# Patient Record
Sex: Female | Born: 1980 | Race: Black or African American | Hispanic: No | Marital: Single | Smoking: Current every day smoker
Health system: Southern US, Community
[De-identification: ages and names within clinical notes are randomized; demographics above are authoritative.]

## PROBLEM LIST (undated history)

## (undated) HISTORY — PX: COLPOSCOPY: SHX161

---
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
## 2003-12-29 ENCOUNTER — Other Ambulatory Visit: Admission: RE | Admit: 2003-12-29 | Discharge: 2003-12-29 | Payer: Self-pay | Admitting: Obstetrics and Gynecology

## 2004-02-14 ENCOUNTER — Inpatient Hospital Stay (HOSPITAL_COMMUNITY): Admission: AD | Admit: 2004-02-14 | Discharge: 2004-02-15 | Payer: Self-pay | Admitting: Obstetrics and Gynecology

## 2004-04-19 ENCOUNTER — Emergency Department (HOSPITAL_COMMUNITY): Admission: EM | Admit: 2004-04-19 | Discharge: 2004-04-20 | Payer: Self-pay | Admitting: Emergency Medicine

## 2004-07-28 ENCOUNTER — Emergency Department (HOSPITAL_COMMUNITY): Admission: EM | Admit: 2004-07-28 | Discharge: 2004-07-28 | Payer: Self-pay | Admitting: Emergency Medicine

## 2006-09-03 ENCOUNTER — Encounter: Admission: RE | Admit: 2006-09-03 | Discharge: 2006-09-03 | Payer: Self-pay | Admitting: Family Medicine

## 2007-06-30 ENCOUNTER — Emergency Department (HOSPITAL_COMMUNITY): Admission: EM | Admit: 2007-06-30 | Discharge: 2007-06-30 | Payer: Self-pay | Admitting: Emergency Medicine

## 2007-07-24 ENCOUNTER — Ambulatory Visit: Payer: Self-pay | Admitting: Obstetrics & Gynecology

## 2007-09-04 ENCOUNTER — Ambulatory Visit: Payer: Self-pay | Admitting: Family Medicine

## 2007-09-04 ENCOUNTER — Other Ambulatory Visit: Admission: RE | Admit: 2007-09-04 | Discharge: 2007-09-04 | Payer: Self-pay | Admitting: Family Medicine

## 2007-09-25 ENCOUNTER — Ambulatory Visit: Payer: Self-pay | Admitting: Gynecology

## 2007-10-02 ENCOUNTER — Ambulatory Visit: Payer: Self-pay | Admitting: Obstetrics & Gynecology

## 2010-02-26 ENCOUNTER — Emergency Department (HOSPITAL_COMMUNITY): Admission: EM | Admit: 2010-02-26 | Discharge: 2010-02-26 | Payer: Self-pay | Admitting: Emergency Medicine

## 2010-09-10 ENCOUNTER — Encounter: Payer: Self-pay | Admitting: Family Medicine

## 2011-01-02 NOTE — Group Therapy Note (Signed)
[name], [name] NO.:  [id number]   MEDICAL RECORD NO.:  [id number]          PATIENT TYPE:  WOC   LOCATION:  [facility]                   FACILITY:  [facility]   PHYSICIAN:  Tinnie Gens, MD        DATE OF BIRTH:  01/30/1981   DATE OF SERVICE:  09/04/2007                                  CLINIC NOTE   CHIEF COMPLAINT:  Need for LEEP.   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  She has been counseled at 46.   PROCEDURE:  After informed consent colposcopy is performed.  No  significant her eyes were seen by culpo.  However her biopsy as  previously been shown to have a CIN III so a medium-sized fissure cone  LEEP was performed after paracervical block with lidocaine.  Post LEEP  with the LEEP was cauterized with the electrocautery ball and hemostasis  obtained with monoethyl solution the patient tolerated the procedure  well.   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   PLAN:  Follow up 2 weeks for recheck of LEEP and review of pathology.  The patient will require Pap smears q.4 months for the next year.           ______________________________  Tinnie Gens, MD     TP/MEDQ  D:  09/04/2007  T:  09/04/2007  Job:  [id number]

## 2011-05-10 LAB — POCT PREGNANCY, URINE
Operator id: 148111
Preg Test, Ur: NEGATIVE

## 2011-05-11 LAB — POCT URINALYSIS DIP (DEVICE)
Bilirubin Urine: NEGATIVE
Glucose, UA: NEGATIVE
Nitrite: NEGATIVE

## 2011-05-29 LAB — COMPREHENSIVE METABOLIC PANEL
ALT: 15
Alkaline Phosphatase: 62
CO2: 26
Chloride: 106
GFR calc non Af Amer: >60
Glucose, Bld: 87
Potassium: 4
Sodium: 138
Total Protein: 7.3

## 2011-05-29 LAB — DIFFERENTIAL
Basophils Relative: 0
Eosinophils Absolute: 0.1 — ABNORMAL LOW
Monocytes Relative: 8
Neutrophils Relative %: 67

## 2011-05-29 LAB — POCT PREGNANCY, URINE: Operator id: 231701

## 2011-05-29 LAB — CBC
Hemoglobin: 14.4
RBC: 4.81
WBC: 5.2

## 2011-07-31 ENCOUNTER — Ambulatory Visit: Payer: Self-pay

## 2013-02-11 ENCOUNTER — Emergency Department (HOSPITAL_COMMUNITY): Payer: Self-pay

## 2013-02-11 ENCOUNTER — Emergency Department (HOSPITAL_COMMUNITY)
Admission: EM | Admit: 2013-02-11 | Discharge: 2013-02-11 | Disposition: A | Discharge status: Home / Self Care | Payer: Self-pay | Attending: Emergency Medicine | Admitting: Emergency Medicine

## 2013-02-11 ENCOUNTER — Encounter (HOSPITAL_COMMUNITY): Payer: Self-pay | Admitting: Emergency Medicine

## 2013-02-11 DIAGNOSIS — N390 Urinary tract infection, site not specified: Secondary | ICD-10-CM | POA: Insufficient documentation

## 2013-02-11 DIAGNOSIS — K5900 Constipation, unspecified: Secondary | ICD-10-CM

## 2013-02-11 DIAGNOSIS — Z3202 Encounter for pregnancy test, result negative: Secondary | ICD-10-CM | POA: Insufficient documentation

## 2013-02-11 DIAGNOSIS — F17200 Nicotine dependence, unspecified, uncomplicated: Secondary | ICD-10-CM | POA: Insufficient documentation

## 2013-02-11 LAB — CBC WITH DIFFERENTIAL/PLATELET
Basophils Absolute: 0 10*3/uL (ref 0.0–0.1)
Basophils Relative: 0 % (ref 0–1)
HCT: 42.3 % (ref 36.0–46.0)
Lymphocytes Relative: 32 % (ref 12–46)
MCHC: 35 g/dL (ref 30.0–36.0)
Monocytes Absolute: 0.5 10*3/uL (ref 0.1–1.0)
Neutro Abs: 2.8 10*3/uL (ref 1.7–7.7)
Neutrophils Relative %: 56 % (ref 43–77)
Platelets: 276 10*3/uL (ref 150–400)
RDW: 12.5 % (ref 11.5–15.5)
WBC: 5 10*3/uL (ref 4.0–10.5)

## 2013-02-11 LAB — COMPREHENSIVE METABOLIC PANEL
ALT: 12 U/L (ref 0–35)
AST: 16 U/L (ref 0–37)
Albumin: 4 g/dL (ref 3.5–5.2)
CO2: 23 mEq/L (ref 19–32)
Chloride: 104 mEq/L (ref 96–112)
Creatinine, Ser: 0.91 mg/dL (ref 0.50–1.10)
Sodium: 136 mEq/L (ref 135–145)
Total Bilirubin: 0.3 mg/dL (ref 0.3–1.2)

## 2013-02-11 LAB — URINALYSIS, ROUTINE W REFLEX MICROSCOPIC
Glucose, UA: 100 mg/dL — AB
Hgb urine dipstick: NEGATIVE
Ketones, ur: NEGATIVE mg/dL
Leukocytes, UA: NEGATIVE
pH: 6.5 (ref 5.0–8.0)

## 2013-02-11 LAB — PREGNANCY, URINE: Preg Test, Ur: NEGATIVE

## 2013-02-11 NOTE — ED Provider Notes (Signed)
History    CSN: [id number] Arrival date & time 02/11/13  1229  First MD Initiated Contact with Patient 02/11/13 1327     Chief Complaint  Patient presents with  . Abdominal Pain  . Constipation   (Consider location/radiation/quality/duration/timing/severity/associated sxs/prior Treatment) Patient is a 32 y.o. female presenting with abdominal pain and constipation. The history is provided by the patient (the pt complains of constipation).  Abdominal Pain This is a recurrent problem. The current episode started 2 days ago. The problem occurs constantly. The problem has not changed since onset.Associated symptoms include abdominal pain. Pertinent negatives include no chest pain and no headaches. Nothing aggravates the symptoms. Nothing relieves the symptoms.  Constipation Associated symptoms: abdominal pain   Associated symptoms: no back pain and no diarrhea    History reviewed. No pertinent past medical history. Past Surgical History  Procedure Laterality Date  . Colposcopy     History reviewed. No pertinent family history. History  Substance Use Topics  . Smoking status: Current Every Day Smoker -- 0.50 packs/day    Types: Cigarettes  . Smokeless tobacco: Not on file  . Alcohol Use: No   OB History   Grav Para Term Preterm Abortions TAB SAB Ect Mult Living                 Review of Systems  Constitutional: Negative for appetite change and fatigue.  HENT: Negative for congestion, sinus pressure and ear discharge.   Eyes: Negative for discharge.  Respiratory: Negative for cough.   Cardiovascular: Negative for chest pain.  Gastrointestinal: Positive for abdominal pain and constipation. Negative for diarrhea.  Genitourinary: Negative for frequency and hematuria.  Musculoskeletal: Negative for back pain.  Skin: Negative for rash.  Neurological: Negative for seizures and headaches.  Psychiatric/Behavioral: Negative for hallucinations.    Allergies  Review of patient's  allergies indicates no known allergies.  Home Medications   Current Outpatient Rx  Name  Route  Sig  Dispense  Refill  . acetaminophen (TYLENOL) 500 MG tablet   Oral   Take 500 mg by mouth every 6 (six) hours as needed for pain.         Marland Kitchen ibuprofen (ADVIL,MOTRIN) 200 MG tablet   Oral   Take 200 mg by mouth every 6 (six) hours as needed for pain.          BP 146/92  Pulse 88  Temp(Src) 98.8 F (37.1 C) (Oral)  Resp 18  SpO2 100%  LMP 01/27/2013 Physical Exam  Constitutional: She is oriented to person, place, and time. She appears well-developed.  HENT:  Head: Normocephalic.  Eyes: Conjunctivae and EOM are normal. No scleral icterus.  Neck: Neck supple. No thyromegaly present.  Cardiovascular: Normal rate and regular rhythm.  Exam reveals no gallop and no friction rub.   No murmur heard. Pulmonary/Chest: No stridor. She has no wheezes. She has no rales. She exhibits no tenderness.  Abdominal: She exhibits no distension. There is tenderness. There is no rebound.  Minimal llq tender  Musculoskeletal: Normal range of motion. She exhibits no edema.  Lymphadenopathy:    She has no cervical adenopathy.  Neurological: She is oriented to person, place, and time. Coordination normal.  Skin: No rash noted. No erythema.  Psychiatric: She has a normal mood and affect. Her behavior is normal.    ED Course  Procedures (including critical care time) Labs Reviewed  COMPREHENSIVE METABOLIC PANEL - Abnormal; Notable for the following:    GFR calc non Af Denyse Dago  84 (*)    All other components within normal limits  URINALYSIS, ROUTINE W REFLEX MICROSCOPIC - Abnormal; Notable for the following:    Glucose, UA 100 (*)    All other components within normal limits  CBC WITH DIFFERENTIAL  LIPASE, BLOOD  PREGNANCY, URINE   Dg Abd Acute W/chest  02/11/2013   *RADIOLOGY REPORT*  Clinical Data: Constipation, tachycardia, left sided abdominal pain.  ACUTE ABDOMEN SERIES (ABDOMEN 2 VIEW &  CHEST 1 VIEW)  Comparison: None.  Findings: Frontal view of the chest shows midline trachea and normal heart size.  Lungs are clear.  No pleural fluid.  Two views of the abdomen show a fair amount of stool in the colon. No small bowel dilatation.  No unexpected radiopaque calculi.  IMPRESSION: Bowel gas pattern is indicative of constipation.   Original Report Authenticated By: Leanna Battles, M.D.   1. Constipation     MDM    Benny Lennert, MD 02/11/13 [phone]

## 2013-02-11 NOTE — Progress Notes (Signed)
[facility] has seen patient and provided her with a oc application. °

## 2013-02-11 NOTE — ED Notes (Signed)
Pt states that she has had LUQ abd pain since the weekend.  States that she has not had a BM since last Thursday.  Denies N/V.

## 2019-05-22 ENCOUNTER — Ambulatory Visit (HOSPITAL_COMMUNITY)
Admission: EM | Admit: 2019-05-22 | Discharge: 2019-05-22 | Disposition: A | Discharge status: Home / Self Care | Payer: BC Managed Care – PPO | Attending: Physician Assistant | Admitting: Physician Assistant

## 2019-05-22 ENCOUNTER — Other Ambulatory Visit: Payer: Self-pay

## 2019-05-22 DIAGNOSIS — R220 Localized swelling, mass and lump, head: Secondary | ICD-10-CM

## 2019-05-22 NOTE — ED Provider Notes (Signed)
[place]    CSN: [id number] Arrival date & time: 05/22/19  1820      History   Chief Complaint No chief complaint on file.   HPI Tiffany Conrad is a 38 y.o. female.    Presents with "swelling" to the left temporal region. Patient reports a 2 week history of episodic puffiness of her left temporal region. She is concerned that it might be an "air pocket". She denies trauma. No surgery. No tooth or ear pain. No swollen nodes and no pain to this area. No redness or warmth. No changes in vision. No eye or orbit pain.      No past medical history on file.  There are no active problems to display for this patient.   Past Surgical History:  Procedure Laterality Date  . COLPOSCOPY      OB History   No obstetric history on file.      Home Medications    Prior to Admission medications   Medication Sig Start Date End Date Taking? Authorizing Provider  acetaminophen (TYLENOL) 500 MG tablet Take 500 mg by mouth every 6 (six) hours as needed for pain.    [provider]  ibuprofen (ADVIL,MOTRIN) 200 MG tablet Take 200 mg by mouth every 6 (six) hours as needed for pain.    [provider]    Family History No family history on file.  Social History Social History   Tobacco Use  . Smoking status: Current Every Day Smoker    Packs/day: 0.50    Types: Cigarettes  Substance Use Topics  . Alcohol use: No  . Drug use: No     Allergies   Patient has no known allergies.   Review of Systems Review of Systems  Constitutional: Negative for fever.  HENT: Negative.   Eyes: Negative.   Respiratory: Negative for cough.   Musculoskeletal: Negative for gait problem.  Skin: Negative.   Neurological: Negative for dizziness, facial asymmetry, weakness, light-headedness and headaches.     Physical Exam Triage Vital Signs ED Triage Vitals  Enc Vitals Group     BP      Pulse      Resp      Temp      Temp src      SpO2      Weight      Height      Head Circumference      Peak Flow      Pain Score      Pain Loc      Pain Edu?      Excl. in [place]?    No data found.  Updated Vital Signs There were no vitals taken for this visit.  Visual Acuity Right Eye Distance:   Left Eye Distance:   Bilateral Distance:    Right Eye Near:   Left Eye Near:    Bilateral Near:     Physical Exam Vitals signs and nursing note reviewed.  Constitutional:      General: She is not in acute distress.    Appearance: Normal appearance. She is not ill-appearing.  HENT:     Head: Normocephalic and atraumatic.     Comments: Temporal region and face is symmetric without any evidence of swelling to the temporal region. No pain to palpation in this area, no erythema or warmth. No lesions    Right Ear: Tympanic membrane normal.     Left Ear: Tympanic membrane normal.     Nose: Nose  normal.     Mouth/Throat:     Mouth: Mucous membranes are moist.     Pharynx: Oropharynx is clear.  Eyes:     General: No scleral icterus.    Extraocular Movements: Extraocular movements intact.     Pupils: Pupils are equal, round, and reactive to light.  Neck:     Musculoskeletal: Normal range of motion.  Cardiovascular:     Rate and Rhythm: Normal rate.  Pulmonary:     Effort: Pulmonary effort is normal.  Lymphadenopathy:     Cervical: No cervical adenopathy.  Skin:    General: Skin is warm and dry.  Neurological:     General: No focal deficit present.     Mental Status: She is alert and oriented to person, place, and time.     Cranial Nerves: No cranial nerve deficit.  Psychiatric:        Mood and Affect: Mood normal.      UC Treatments / Results  Labs (all labs ordered are listed, but only abnormal results are displayed) Labs Reviewed - No data to display  EKG   Radiology No results found.  Procedures Procedures (including critical care time)  Medications Ordered in UC Medications - No data to display  Initial Impression /  Assessment and Plan / UC Course  I have reviewed the triage vital signs and the nursing notes.  Pertinent labs & imaging results that were available during my care of the patient were reviewed by me and considered in my medical decision making (see chart for details).     Presentation of temporal swelling is not appreciated on exam and no additional symptoms to warrant additional emergency needs. Suggest f/u with her GYN who serves as her PCP for full evaluation next time she experiences the swelling. I could not appreciate this today, but she may go to the ED if she begins to have worsening symptoms or pain.  Final Clinical Impressions(s) / UC Diagnoses   Final diagnoses:  None   Discharge Instructions   None    ED Prescriptions    None     PDMP not reviewed this encounter.   Riki Sheer, [place] 05/22/19 [phone]

## 2019-05-22 NOTE — Discharge Instructions (Addendum)
Patient left without paperwork.

## 2019-05-22 NOTE — ED Triage Notes (Addendum)
Patient report having swelling  in her left side of the face and tingling sensation en the left ear on and off x 1 year, this pass 2 are worse.

## 2019-05-28 DIAGNOSIS — Z6834 Body mass index (BMI) 34.0-34.9, adult: Secondary | ICD-10-CM | POA: Diagnosis not present

## 2019-05-28 DIAGNOSIS — Z0000 Encounter for general adult medical examination without abnormal findings: Secondary | ICD-10-CM | POA: Diagnosis not present

## 2019-05-28 DIAGNOSIS — Z1151 Encounter for screening for human papillomavirus (HPV): Secondary | ICD-10-CM | POA: Diagnosis not present

## 2019-05-28 DIAGNOSIS — Z1329 Encounter for screening for other suspected endocrine disorder: Secondary | ICD-10-CM | POA: Diagnosis not present

## 2019-05-28 DIAGNOSIS — Z13220 Encounter for screening for lipoid disorders: Secondary | ICD-10-CM | POA: Diagnosis not present

## 2019-05-28 DIAGNOSIS — Z01419 Encounter for gynecological examination (general) (routine) without abnormal findings: Secondary | ICD-10-CM | POA: Diagnosis not present

## 2019-05-28 DIAGNOSIS — Z131 Encounter for screening for diabetes mellitus: Secondary | ICD-10-CM | POA: Diagnosis not present

## 2019-05-28 DIAGNOSIS — Z130 Encounter for screening for diseases of the blood and blood-forming organs and certain disorders involving the immune mechanism: Secondary | ICD-10-CM | POA: Diagnosis not present

## 2019-07-03 DIAGNOSIS — Z118 Encounter for screening for other infectious and parasitic diseases: Secondary | ICD-10-CM | POA: Diagnosis not present

## 2019-07-03 DIAGNOSIS — N83201 Unspecified ovarian cyst, right side: Secondary | ICD-10-CM | POA: Diagnosis not present

## 2019-07-03 DIAGNOSIS — N920 Excessive and frequent menstruation with regular cycle: Secondary | ICD-10-CM | POA: Diagnosis not present

## 2019-07-29 DIAGNOSIS — Z1159 Encounter for screening for other viral diseases: Secondary | ICD-10-CM | POA: Diagnosis not present

## 2019-07-29 DIAGNOSIS — Z20828 Contact with and (suspected) exposure to other viral communicable diseases: Secondary | ICD-10-CM | POA: Diagnosis not present

## 2019-09-05 DIAGNOSIS — Z1159 Encounter for screening for other viral diseases: Secondary | ICD-10-CM | POA: Diagnosis not present

## 2019-09-05 DIAGNOSIS — Z20828 Contact with and (suspected) exposure to other viral communicable diseases: Secondary | ICD-10-CM | POA: Diagnosis not present

## 2019-09-10 DIAGNOSIS — Z20828 Contact with and (suspected) exposure to other viral communicable diseases: Secondary | ICD-10-CM | POA: Diagnosis not present

## 2019-09-10 DIAGNOSIS — Z1159 Encounter for screening for other viral diseases: Secondary | ICD-10-CM | POA: Diagnosis not present

## 2019-09-17 DIAGNOSIS — Z1159 Encounter for screening for other viral diseases: Secondary | ICD-10-CM | POA: Diagnosis not present

## 2019-09-17 DIAGNOSIS — Z20828 Contact with and (suspected) exposure to other viral communicable diseases: Secondary | ICD-10-CM | POA: Diagnosis not present

## 2019-09-21 DIAGNOSIS — Z20828 Contact with and (suspected) exposure to other viral communicable diseases: Secondary | ICD-10-CM | POA: Diagnosis not present

## 2019-09-21 DIAGNOSIS — Z1159 Encounter for screening for other viral diseases: Secondary | ICD-10-CM | POA: Diagnosis not present

## 2019-09-24 DIAGNOSIS — Z1159 Encounter for screening for other viral diseases: Secondary | ICD-10-CM | POA: Diagnosis not present

## 2019-09-24 DIAGNOSIS — Z20828 Contact with and (suspected) exposure to other viral communicable diseases: Secondary | ICD-10-CM | POA: Diagnosis not present

## 2019-10-01 DIAGNOSIS — Z1159 Encounter for screening for other viral diseases: Secondary | ICD-10-CM | POA: Diagnosis not present

## 2019-10-01 DIAGNOSIS — Z20828 Contact with and (suspected) exposure to other viral communicable diseases: Secondary | ICD-10-CM | POA: Diagnosis not present

## 2019-10-05 DIAGNOSIS — Z1159 Encounter for screening for other viral diseases: Secondary | ICD-10-CM | POA: Diagnosis not present

## 2019-10-05 DIAGNOSIS — Z20828 Contact with and (suspected) exposure to other viral communicable diseases: Secondary | ICD-10-CM | POA: Diagnosis not present

## 2019-10-15 DIAGNOSIS — Z20828 Contact with and (suspected) exposure to other viral communicable diseases: Secondary | ICD-10-CM | POA: Diagnosis not present

## 2019-10-15 DIAGNOSIS — Z1159 Encounter for screening for other viral diseases: Secondary | ICD-10-CM | POA: Diagnosis not present

## 2019-10-19 DIAGNOSIS — Z20828 Contact with and (suspected) exposure to other viral communicable diseases: Secondary | ICD-10-CM | POA: Diagnosis not present

## 2019-10-19 DIAGNOSIS — Z1159 Encounter for screening for other viral diseases: Secondary | ICD-10-CM | POA: Diagnosis not present

## 2019-10-22 DIAGNOSIS — Z1159 Encounter for screening for other viral diseases: Secondary | ICD-10-CM | POA: Diagnosis not present

## 2019-10-22 DIAGNOSIS — Z20828 Contact with and (suspected) exposure to other viral communicable diseases: Secondary | ICD-10-CM | POA: Diagnosis not present

## 2019-10-29 DIAGNOSIS — Z20828 Contact with and (suspected) exposure to other viral communicable diseases: Secondary | ICD-10-CM | POA: Diagnosis not present

## 2019-10-29 DIAGNOSIS — Z1159 Encounter for screening for other viral diseases: Secondary | ICD-10-CM | POA: Diagnosis not present

## 2019-11-02 DIAGNOSIS — Z20828 Contact with and (suspected) exposure to other viral communicable diseases: Secondary | ICD-10-CM | POA: Diagnosis not present

## 2019-11-02 DIAGNOSIS — Z1159 Encounter for screening for other viral diseases: Secondary | ICD-10-CM | POA: Diagnosis not present

## 2019-11-05 DIAGNOSIS — Z1159 Encounter for screening for other viral diseases: Secondary | ICD-10-CM | POA: Diagnosis not present

## 2019-11-05 DIAGNOSIS — Z20828 Contact with and (suspected) exposure to other viral communicable diseases: Secondary | ICD-10-CM | POA: Diagnosis not present

## 2019-11-12 DIAGNOSIS — Z1159 Encounter for screening for other viral diseases: Secondary | ICD-10-CM | POA: Diagnosis not present

## 2019-11-12 DIAGNOSIS — Z20828 Contact with and (suspected) exposure to other viral communicable diseases: Secondary | ICD-10-CM | POA: Diagnosis not present

## 2019-11-16 DIAGNOSIS — Z20828 Contact with and (suspected) exposure to other viral communicable diseases: Secondary | ICD-10-CM | POA: Diagnosis not present

## 2019-11-16 DIAGNOSIS — Z1159 Encounter for screening for other viral diseases: Secondary | ICD-10-CM | POA: Diagnosis not present

## 2019-11-19 DIAGNOSIS — Z1159 Encounter for screening for other viral diseases: Secondary | ICD-10-CM | POA: Diagnosis not present

## 2019-11-19 DIAGNOSIS — Z20828 Contact with and (suspected) exposure to other viral communicable diseases: Secondary | ICD-10-CM | POA: Diagnosis not present

## 2019-11-26 DIAGNOSIS — Z1159 Encounter for screening for other viral diseases: Secondary | ICD-10-CM | POA: Diagnosis not present

## 2019-11-26 DIAGNOSIS — Z20828 Contact with and (suspected) exposure to other viral communicable diseases: Secondary | ICD-10-CM | POA: Diagnosis not present

## 2019-11-30 DIAGNOSIS — Z1159 Encounter for screening for other viral diseases: Secondary | ICD-10-CM | POA: Diagnosis not present

## 2019-11-30 DIAGNOSIS — Z20828 Contact with and (suspected) exposure to other viral communicable diseases: Secondary | ICD-10-CM | POA: Diagnosis not present

## 2019-12-10 DIAGNOSIS — Z1159 Encounter for screening for other viral diseases: Secondary | ICD-10-CM | POA: Diagnosis not present

## 2019-12-10 DIAGNOSIS — Z20828 Contact with and (suspected) exposure to other viral communicable diseases: Secondary | ICD-10-CM | POA: Diagnosis not present

## 2019-12-14 DIAGNOSIS — Z20828 Contact with and (suspected) exposure to other viral communicable diseases: Secondary | ICD-10-CM | POA: Diagnosis not present

## 2019-12-14 DIAGNOSIS — Z1159 Encounter for screening for other viral diseases: Secondary | ICD-10-CM | POA: Diagnosis not present

## 2019-12-17 DIAGNOSIS — Z20828 Contact with and (suspected) exposure to other viral communicable diseases: Secondary | ICD-10-CM | POA: Diagnosis not present

## 2019-12-17 DIAGNOSIS — Z1159 Encounter for screening for other viral diseases: Secondary | ICD-10-CM | POA: Diagnosis not present

## 2019-12-24 DIAGNOSIS — Z20828 Contact with and (suspected) exposure to other viral communicable diseases: Secondary | ICD-10-CM | POA: Diagnosis not present

## 2019-12-24 DIAGNOSIS — Z1159 Encounter for screening for other viral diseases: Secondary | ICD-10-CM | POA: Diagnosis not present

## 2019-12-28 DIAGNOSIS — Z20828 Contact with and (suspected) exposure to other viral communicable diseases: Secondary | ICD-10-CM | POA: Diagnosis not present

## 2019-12-28 DIAGNOSIS — Z1159 Encounter for screening for other viral diseases: Secondary | ICD-10-CM | POA: Diagnosis not present

## 2019-12-31 DIAGNOSIS — Z20828 Contact with and (suspected) exposure to other viral communicable diseases: Secondary | ICD-10-CM | POA: Diagnosis not present

## 2019-12-31 DIAGNOSIS — Z1159 Encounter for screening for other viral diseases: Secondary | ICD-10-CM | POA: Diagnosis not present

## 2020-01-04 DIAGNOSIS — Z20828 Contact with and (suspected) exposure to other viral communicable diseases: Secondary | ICD-10-CM | POA: Diagnosis not present

## 2020-01-04 DIAGNOSIS — Z1159 Encounter for screening for other viral diseases: Secondary | ICD-10-CM | POA: Diagnosis not present

## 2020-01-11 DIAGNOSIS — Z20828 Contact with and (suspected) exposure to other viral communicable diseases: Secondary | ICD-10-CM | POA: Diagnosis not present

## 2020-01-11 DIAGNOSIS — Z20822 Contact with and (suspected) exposure to covid-19: Secondary | ICD-10-CM | POA: Diagnosis not present

## 2020-01-11 DIAGNOSIS — F17210 Nicotine dependence, cigarettes, uncomplicated: Secondary | ICD-10-CM | POA: Diagnosis not present

## 2020-01-11 DIAGNOSIS — Z1159 Encounter for screening for other viral diseases: Secondary | ICD-10-CM | POA: Diagnosis not present

## 2020-01-11 DIAGNOSIS — Z0000 Encounter for general adult medical examination without abnormal findings: Secondary | ICD-10-CM | POA: Diagnosis not present

## 2020-01-11 DIAGNOSIS — Z131 Encounter for screening for diabetes mellitus: Secondary | ICD-10-CM | POA: Diagnosis not present

## 2020-01-11 DIAGNOSIS — L929 Granulomatous disorder of the skin and subcutaneous tissue, unspecified: Secondary | ICD-10-CM | POA: Diagnosis not present

## 2020-01-11 DIAGNOSIS — Z13220 Encounter for screening for lipoid disorders: Secondary | ICD-10-CM | POA: Diagnosis not present

## 2020-01-14 DIAGNOSIS — Z1159 Encounter for screening for other viral diseases: Secondary | ICD-10-CM | POA: Diagnosis not present

## 2020-01-14 DIAGNOSIS — Z20828 Contact with and (suspected) exposure to other viral communicable diseases: Secondary | ICD-10-CM | POA: Diagnosis not present

## 2020-01-21 DIAGNOSIS — Z1159 Encounter for screening for other viral diseases: Secondary | ICD-10-CM | POA: Diagnosis not present

## 2020-01-21 DIAGNOSIS — Z20828 Contact with and (suspected) exposure to other viral communicable diseases: Secondary | ICD-10-CM | POA: Diagnosis not present

## 2020-01-25 DIAGNOSIS — Z1159 Encounter for screening for other viral diseases: Secondary | ICD-10-CM | POA: Diagnosis not present

## 2020-01-25 DIAGNOSIS — Z20828 Contact with and (suspected) exposure to other viral communicable diseases: Secondary | ICD-10-CM | POA: Diagnosis not present

## 2020-01-28 DIAGNOSIS — Z1159 Encounter for screening for other viral diseases: Secondary | ICD-10-CM | POA: Diagnosis not present

## 2020-01-28 DIAGNOSIS — Z20828 Contact with and (suspected) exposure to other viral communicable diseases: Secondary | ICD-10-CM | POA: Diagnosis not present

## 2020-02-08 DIAGNOSIS — Z1159 Encounter for screening for other viral diseases: Secondary | ICD-10-CM | POA: Diagnosis not present

## 2020-02-08 DIAGNOSIS — Z20828 Contact with and (suspected) exposure to other viral communicable diseases: Secondary | ICD-10-CM | POA: Diagnosis not present

## 2020-02-11 DIAGNOSIS — Z1159 Encounter for screening for other viral diseases: Secondary | ICD-10-CM | POA: Diagnosis not present

## 2020-02-11 DIAGNOSIS — Z20828 Contact with and (suspected) exposure to other viral communicable diseases: Secondary | ICD-10-CM | POA: Diagnosis not present

## 2020-02-18 DIAGNOSIS — Z20828 Contact with and (suspected) exposure to other viral communicable diseases: Secondary | ICD-10-CM | POA: Diagnosis not present

## 2020-02-18 DIAGNOSIS — Z1159 Encounter for screening for other viral diseases: Secondary | ICD-10-CM | POA: Diagnosis not present

## 2020-02-22 DIAGNOSIS — Z1159 Encounter for screening for other viral diseases: Secondary | ICD-10-CM | POA: Diagnosis not present

## 2020-02-22 DIAGNOSIS — Z20828 Contact with and (suspected) exposure to other viral communicable diseases: Secondary | ICD-10-CM | POA: Diagnosis not present

## 2020-02-25 DIAGNOSIS — Z20828 Contact with and (suspected) exposure to other viral communicable diseases: Secondary | ICD-10-CM | POA: Diagnosis not present

## 2020-02-25 DIAGNOSIS — Z1159 Encounter for screening for other viral diseases: Secondary | ICD-10-CM | POA: Diagnosis not present

## 2020-03-03 DIAGNOSIS — Z20828 Contact with and (suspected) exposure to other viral communicable diseases: Secondary | ICD-10-CM | POA: Diagnosis not present

## 2020-03-03 DIAGNOSIS — Z1159 Encounter for screening for other viral diseases: Secondary | ICD-10-CM | POA: Diagnosis not present

## 2020-03-10 DIAGNOSIS — Z20828 Contact with and (suspected) exposure to other viral communicable diseases: Secondary | ICD-10-CM | POA: Diagnosis not present

## 2020-03-10 DIAGNOSIS — Z1159 Encounter for screening for other viral diseases: Secondary | ICD-10-CM | POA: Diagnosis not present

## 2020-03-17 DIAGNOSIS — Z20828 Contact with and (suspected) exposure to other viral communicable diseases: Secondary | ICD-10-CM | POA: Diagnosis not present

## 2020-03-17 DIAGNOSIS — Z1159 Encounter for screening for other viral diseases: Secondary | ICD-10-CM | POA: Diagnosis not present

## 2020-03-21 DIAGNOSIS — Z20828 Contact with and (suspected) exposure to other viral communicable diseases: Secondary | ICD-10-CM | POA: Diagnosis not present

## 2020-03-21 DIAGNOSIS — Z1159 Encounter for screening for other viral diseases: Secondary | ICD-10-CM | POA: Diagnosis not present

## 2020-03-24 DIAGNOSIS — Z1159 Encounter for screening for other viral diseases: Secondary | ICD-10-CM | POA: Diagnosis not present

## 2020-03-24 DIAGNOSIS — Z20828 Contact with and (suspected) exposure to other viral communicable diseases: Secondary | ICD-10-CM | POA: Diagnosis not present

## 2020-03-31 DIAGNOSIS — Z1159 Encounter for screening for other viral diseases: Secondary | ICD-10-CM | POA: Diagnosis not present

## 2020-03-31 DIAGNOSIS — Z20828 Contact with and (suspected) exposure to other viral communicable diseases: Secondary | ICD-10-CM | POA: Diagnosis not present

## 2020-04-04 DIAGNOSIS — Z20828 Contact with and (suspected) exposure to other viral communicable diseases: Secondary | ICD-10-CM | POA: Diagnosis not present

## 2020-04-04 DIAGNOSIS — Z1159 Encounter for screening for other viral diseases: Secondary | ICD-10-CM | POA: Diagnosis not present

## 2020-04-07 DIAGNOSIS — Z1159 Encounter for screening for other viral diseases: Secondary | ICD-10-CM | POA: Diagnosis not present

## 2020-04-07 DIAGNOSIS — Z20828 Contact with and (suspected) exposure to other viral communicable diseases: Secondary | ICD-10-CM | POA: Diagnosis not present

## 2020-04-14 DIAGNOSIS — Z20828 Contact with and (suspected) exposure to other viral communicable diseases: Secondary | ICD-10-CM | POA: Diagnosis not present

## 2020-04-14 DIAGNOSIS — Z1159 Encounter for screening for other viral diseases: Secondary | ICD-10-CM | POA: Diagnosis not present

## 2020-04-18 DIAGNOSIS — Z20828 Contact with and (suspected) exposure to other viral communicable diseases: Secondary | ICD-10-CM | POA: Diagnosis not present

## 2020-04-18 DIAGNOSIS — Z1159 Encounter for screening for other viral diseases: Secondary | ICD-10-CM | POA: Diagnosis not present

## 2020-04-21 DIAGNOSIS — Z20828 Contact with and (suspected) exposure to other viral communicable diseases: Secondary | ICD-10-CM | POA: Diagnosis not present

## 2020-04-21 DIAGNOSIS — Z1159 Encounter for screening for other viral diseases: Secondary | ICD-10-CM | POA: Diagnosis not present

## 2020-04-28 DIAGNOSIS — Z20828 Contact with and (suspected) exposure to other viral communicable diseases: Secondary | ICD-10-CM | POA: Diagnosis not present

## 2020-04-28 DIAGNOSIS — Z1159 Encounter for screening for other viral diseases: Secondary | ICD-10-CM | POA: Diagnosis not present

## 2020-05-02 DIAGNOSIS — Z1159 Encounter for screening for other viral diseases: Secondary | ICD-10-CM | POA: Diagnosis not present

## 2020-05-02 DIAGNOSIS — Z20828 Contact with and (suspected) exposure to other viral communicable diseases: Secondary | ICD-10-CM | POA: Diagnosis not present

## 2020-05-05 DIAGNOSIS — Z1159 Encounter for screening for other viral diseases: Secondary | ICD-10-CM | POA: Diagnosis not present

## 2020-05-05 DIAGNOSIS — Z20828 Contact with and (suspected) exposure to other viral communicable diseases: Secondary | ICD-10-CM | POA: Diagnosis not present

## 2020-05-12 DIAGNOSIS — Z20828 Contact with and (suspected) exposure to other viral communicable diseases: Secondary | ICD-10-CM | POA: Diagnosis not present

## 2020-05-12 DIAGNOSIS — Z1159 Encounter for screening for other viral diseases: Secondary | ICD-10-CM | POA: Diagnosis not present

## 2020-05-16 DIAGNOSIS — Z20828 Contact with and (suspected) exposure to other viral communicable diseases: Secondary | ICD-10-CM | POA: Diagnosis not present

## 2020-05-16 DIAGNOSIS — Z1159 Encounter for screening for other viral diseases: Secondary | ICD-10-CM | POA: Diagnosis not present

## 2020-05-19 DIAGNOSIS — Z1159 Encounter for screening for other viral diseases: Secondary | ICD-10-CM | POA: Diagnosis not present

## 2020-05-19 DIAGNOSIS — Z20828 Contact with and (suspected) exposure to other viral communicable diseases: Secondary | ICD-10-CM | POA: Diagnosis not present

## 2020-05-26 DIAGNOSIS — Z20828 Contact with and (suspected) exposure to other viral communicable diseases: Secondary | ICD-10-CM | POA: Diagnosis not present

## 2020-05-26 DIAGNOSIS — Z1159 Encounter for screening for other viral diseases: Secondary | ICD-10-CM | POA: Diagnosis not present

## 2020-05-30 DIAGNOSIS — Z1159 Encounter for screening for other viral diseases: Secondary | ICD-10-CM | POA: Diagnosis not present

## 2020-05-30 DIAGNOSIS — Z20828 Contact with and (suspected) exposure to other viral communicable diseases: Secondary | ICD-10-CM | POA: Diagnosis not present

## 2020-06-02 DIAGNOSIS — Z1159 Encounter for screening for other viral diseases: Secondary | ICD-10-CM | POA: Diagnosis not present

## 2020-06-02 DIAGNOSIS — Z20828 Contact with and (suspected) exposure to other viral communicable diseases: Secondary | ICD-10-CM | POA: Diagnosis not present

## 2020-06-09 DIAGNOSIS — Z20828 Contact with and (suspected) exposure to other viral communicable diseases: Secondary | ICD-10-CM | POA: Diagnosis not present

## 2020-06-09 DIAGNOSIS — Z1159 Encounter for screening for other viral diseases: Secondary | ICD-10-CM | POA: Diagnosis not present

## 2020-06-13 DIAGNOSIS — Z20828 Contact with and (suspected) exposure to other viral communicable diseases: Secondary | ICD-10-CM | POA: Diagnosis not present

## 2020-06-13 DIAGNOSIS — Z1159 Encounter for screening for other viral diseases: Secondary | ICD-10-CM | POA: Diagnosis not present

## 2020-06-16 DIAGNOSIS — Z1159 Encounter for screening for other viral diseases: Secondary | ICD-10-CM | POA: Diagnosis not present

## 2020-06-16 DIAGNOSIS — Z20828 Contact with and (suspected) exposure to other viral communicable diseases: Secondary | ICD-10-CM | POA: Diagnosis not present

## 2020-06-23 DIAGNOSIS — Z1159 Encounter for screening for other viral diseases: Secondary | ICD-10-CM | POA: Diagnosis not present

## 2020-06-23 DIAGNOSIS — Z20828 Contact with and (suspected) exposure to other viral communicable diseases: Secondary | ICD-10-CM | POA: Diagnosis not present

## 2020-06-27 DIAGNOSIS — Z1159 Encounter for screening for other viral diseases: Secondary | ICD-10-CM | POA: Diagnosis not present

## 2020-06-27 DIAGNOSIS — Z20828 Contact with and (suspected) exposure to other viral communicable diseases: Secondary | ICD-10-CM | POA: Diagnosis not present

## 2020-06-30 DIAGNOSIS — Z1159 Encounter for screening for other viral diseases: Secondary | ICD-10-CM | POA: Diagnosis not present

## 2020-06-30 DIAGNOSIS — Z20828 Contact with and (suspected) exposure to other viral communicable diseases: Secondary | ICD-10-CM | POA: Diagnosis not present

## 2020-07-07 DIAGNOSIS — Z20828 Contact with and (suspected) exposure to other viral communicable diseases: Secondary | ICD-10-CM | POA: Diagnosis not present

## 2020-07-07 DIAGNOSIS — Z1159 Encounter for screening for other viral diseases: Secondary | ICD-10-CM | POA: Diagnosis not present

## 2020-07-11 DIAGNOSIS — Z1159 Encounter for screening for other viral diseases: Secondary | ICD-10-CM | POA: Diagnosis not present

## 2020-07-11 DIAGNOSIS — Z20828 Contact with and (suspected) exposure to other viral communicable diseases: Secondary | ICD-10-CM | POA: Diagnosis not present

## 2020-07-20 ENCOUNTER — Other Ambulatory Visit: Payer: Self-pay

## 2020-07-20 ENCOUNTER — Emergency Department (HOSPITAL_COMMUNITY)
Admission: EM | Admit: 2020-07-20 | Discharge: 2020-07-20 | Disposition: A | Discharge status: Home / Self Care | Payer: BC Managed Care – PPO | Attending: Emergency Medicine | Admitting: Emergency Medicine

## 2020-07-20 ENCOUNTER — Emergency Department (HOSPITAL_COMMUNITY): Payer: BC Managed Care – PPO

## 2020-07-20 DIAGNOSIS — F419 Anxiety disorder, unspecified: Secondary | ICD-10-CM | POA: Diagnosis not present

## 2020-07-20 DIAGNOSIS — F17210 Nicotine dependence, cigarettes, uncomplicated: Secondary | ICD-10-CM | POA: Insufficient documentation

## 2020-07-20 DIAGNOSIS — R0789 Other chest pain: Secondary | ICD-10-CM | POA: Insufficient documentation

## 2020-07-20 DIAGNOSIS — J90 Pleural effusion, not elsewhere classified: Secondary | ICD-10-CM | POA: Diagnosis not present

## 2020-07-20 DIAGNOSIS — R079 Chest pain, unspecified: Secondary | ICD-10-CM | POA: Diagnosis not present

## 2020-07-20 DIAGNOSIS — Z7982 Long term (current) use of aspirin: Secondary | ICD-10-CM | POA: Diagnosis not present

## 2020-07-20 LAB — CBC
HCT: 40.9 % (ref 36.0–46.0)
Hemoglobin: 14.2 g/dL (ref 12.0–15.0)
MCH: 30.7 pg (ref 26.0–34.0)
MCHC: 34.7 g/dL (ref 30.0–36.0)
MCV: 88.5 fL (ref 80.0–100.0)
Platelets: 310 10*3/uL (ref 150–400)
RBC: 4.62 MIL/uL (ref 3.87–5.11)
RDW: 13.1 % (ref 11.5–15.5)
WBC: 4.2 10*3/uL (ref 4.0–10.5)
nRBC: 0 % (ref 0.0–0.2)

## 2020-07-20 LAB — BASIC METABOLIC PANEL
Anion gap: 10 (ref 5–15)
BUN: 5 mg/dL — ABNORMAL LOW (ref 6–20)
CO2: 23 mmol/L (ref 22–32)
Calcium: 9.4 mg/dL (ref 8.9–10.3)
Chloride: 102 mmol/L (ref 98–111)
Creatinine, Ser: 0.95 mg/dL (ref 0.44–1.00)
GFR, Estimated: >60 mL/min (ref >60)
Glucose, Bld: 98 mg/dL (ref 70–99)
Potassium: 3.5 mmol/L (ref 3.5–5.1)
Sodium: 135 mmol/L (ref 135–145)

## 2020-07-20 LAB — I-STAT BETA HCG BLOOD, ED (MC, WL, AP ONLY): I-stat hCG, quantitative: <5 m[IU]/mL (ref <5)

## 2020-07-20 LAB — TROPONIN I (HIGH SENSITIVITY): Troponin I (High Sensitivity): <2 ng/L (ref <18)

## 2020-07-20 MED ORDER — OMEPRAZOLE 20 MG PO CPDR: 20.0000 mg | DELAYED_RELEASE_CAPSULE | Freq: Every day | ORAL | 0 refills | Status: DC

## 2020-07-20 NOTE — ED Provider Notes (Signed)
[facility] EMERGENCY DEPARTMENT Provider Note   CSN: [id number] Arrival date & time: 07/20/20  1311     History No chief complaint on file.   Tiffany Conrad is a 39 y.o. female.  HPI Patient is a 39 year old female presented today with complaints of elevated blood pressure.  She states that she has intermittently had elevated blood pressure several times in the past week.  She states that she has some chest pain earlier and was assessed by EMS who told her that her blood pressure was elevated.  She states she felt very anxious when she was told this and so experiencing some left arm tingling and prickly sensations.  She states she also felt upset her stomach she denies any abdominal pain however.  She denies any nausea, vomiting, diaphoresis.  She states that the pain in her chest is completely gone now she states that it was nonradiating nonexertional and does not think that it was pleuritic at the time.  No recent surgeries, no history of cancer, no history of exogenous hormone use, no history of VTE.  She denies any recent long travel or immobilization or orthopedic surgeries or other surgeries.  She denies any lower extremity swelling or edema or calf tenderness.  No fevers no chills no cough or congestion.  No other associated symptoms.  No aggravating mitigating factors.      No past medical history on file.  There are no problems to display for this patient.   Past Surgical History:  Procedure Laterality Date  . COLPOSCOPY       OB History   No obstetric history on file.     No family history on file.  Social History   Tobacco Use  . Smoking status: Current Every Day Smoker    Packs/day: 0.50    Types: Cigarettes  Substance Use Topics  . Alcohol use: No  . Drug use: No    Home Medications Prior to Admission medications   Medication Sig Start Date End Date Taking? Authorizing Provider  acetaminophen (TYLENOL) 500 MG tablet Take 500 mg  by mouth every 6 (six) hours as needed for pain.    [provider]  aspirin EC 81 MG tablet Take 81 mg by mouth daily.    [provider]  ibuprofen (ADVIL,MOTRIN) 200 MG tablet Take 200 mg by mouth every 6 (six) hours as needed for pain.    [provider]  omeprazole (PRILOSEC) 20 MG capsule Take 1 capsule (20 mg total) by mouth daily. 07/20/20   Gailen Shelter, PA    Allergies    Patient has no known allergies.  Review of Systems   Review of Systems  Constitutional: Positive for fatigue. Negative for chills and fever.       Elevated blood pressure  HENT: Negative for congestion.   Eyes: Negative for pain.  Respiratory: Negative for cough and shortness of breath.   Cardiovascular: Positive for chest pain. Negative for leg swelling.  Gastrointestinal: Negative for abdominal pain, diarrhea, nausea and vomiting.  Genitourinary: Negative for dysuria.  Musculoskeletal: Negative for myalgias.  Skin: Negative for rash.  Neurological: Negative for dizziness and headaches.       Left hand tingling/paresthesias  Psychiatric/Behavioral: Negative for agitation. The patient is nervous/anxious.     Physical Exam Updated Vital Signs BP 129/87 (BP Location: Right Arm)   Pulse 82   Temp 98.1 F (36.7 C) (Oral)   Resp 16   SpO2 100%   Physical  Exam Vitals and nursing note reviewed.  Constitutional:      General: She is not in acute distress.    Appearance: She is obese. She is not ill-appearing.     Comments: Pleasant, 40 year old female, appears stated age, obese, able answer questions appropriately commands.  No increased work of breathing.  Speaking full sentences.  HENT:     Head: Normocephalic and atraumatic.     Nose: Nose normal.  Eyes:     General: No scleral icterus. Cardiovascular:     Rate and Rhythm: Normal rate and regular rhythm.     Pulses: Normal pulses.     Heart sounds: Normal heart sounds.     Comments: Pulses 3+ and bilaterally  symmetric. Radial artery pulses palpated. Pulmonary:     Effort: Pulmonary effort is normal. No respiratory distress.     Breath sounds: Normal breath sounds. No wheezing.  Abdominal:     Palpations: Abdomen is soft.     Tenderness: There is no abdominal tenderness.  Musculoskeletal:     Cervical back: Normal range of motion.     Right lower leg: No edema.     Left lower leg: No edema.  Skin:    General: Skin is warm and dry.     Capillary Refill: Capillary refill takes less than 2 seconds.  Neurological:     Mental Status: She is alert. Mental status is at baseline.  Psychiatric:        Mood and Affect: Mood normal.        Behavior: Behavior normal.     Comments: Mildly anxious appearing.     ED Results / Procedures / Treatments   Labs (all labs ordered are listed, but only abnormal results are displayed) Labs Reviewed  BASIC METABOLIC PANEL - Abnormal; Notable for the following components:      Result Value   BUN 5 (*)    All other components within normal limits  CBC  I-STAT BETA HCG BLOOD, ED ([facility], [facility], [facility] ONLY)  TROPONIN I (HIGH SENSITIVITY)    EKG EKG Interpretation  Date/Time:  Wednesday July 20 2020 14:06:51 EST Ventricular Rate:  66 PR Interval:  132 QRS Duration: 78 QT Interval:  406 QTC Calculation: 425 R Axis:   3 Text Interpretation: Normal sinus rhythm Normal ECG Confirmed by Virgina Norfolk [phone] on 07/20/2020 4:08:21 PM   Radiology DG Chest 2 View  Result Date: 07/20/2020 CLINICAL DATA:  Chest pain EXAM: CHEST - 2 VIEW COMPARISON:  None. FINDINGS: Normal heart size. Normal mediastinal contour. No pneumothorax. No pleural effusion. Lungs appear clear, with no acute consolidative airspace disease and no pulmonary edema. IMPRESSION: No active cardiopulmonary disease. Electronically Signed   By: Delbert Phenix M.D.   On: 07/20/2020 14:51    Procedures Procedures (including critical care time)  Medications Ordered in ED Medications - No data to  display  ED Course  I have reviewed the triage vital signs and the nursing notes.  Pertinent labs & imaging results that were available during my care of the patient were reviewed by me and considered in my medical decision making (see chart for details).    MDM Rules/Calculators/A&P                          Patient is a well-appearing 39 year old female presented today with an episode of chest pain that began very early this morning and has completely resolved she felt anxious after she was told she  had an elevated blood pressure.  Currently she has no symptoms apart from feeling somewhat anxious.  She denies any nausea, vomiting, diaphoresis, chest pain, shortness of breath, fevers, chills, lightheadedness, dizziness.  I have low suspicion for ACS given her history, low/fevers factors and the chronicity of her symptoms.  Her EKG is without any acute abnormalities.  Troponin x1 within normal limits/undetectable.  Patient is PERC negative.  I have very low suspicion for PE.  I-STAT hCG negative for pregnancy.  CBC without leukocytosis or anemia.  BMP without any electrolyte abnormalities.  Chest x-ray reviewed by myself shows no acute infiltrates or abnormalities.  I suspect some of this may be related to anxiety.  She also does have a history of reflux and states that she is concerned for this.  We will provide her with Prilosec and recommend close follow-up with PCP.  Given close return precautions.  Low suspicion for myocarditis given negative troponin.  Low suspicion for pericarditis as well.    Final Clinical Impression(s) / ED Diagnoses Final diagnoses:  Atypical chest pain    Rx / DC Orders ED Discharge Orders         Ordered    omeprazole (PRILOSEC) 20 MG capsule  Daily        07/20/20 1657           Gailen Shelter, [place] 07/25/20 1402    Virgina Norfolk, DO 07/28/20 2301

## 2020-07-20 NOTE — Discharge Instructions (Signed)
You are not being diagnosed with hypertension today.  I have printed you some information about the diagnosis of hypertension just for your reading.  Please take the reflux medicine that I prescribed you as prescribed.  Please follow-up with your primary care doctor and continue to monitor your symptoms.  As we discussed, you may return anytime for any new or concerning symptoms.

## 2020-07-20 NOTE — ED Triage Notes (Signed)
Pt here with c/o left arm tingling , and abd pain , ems was at her house at 4 am and b/p was elevated , normal now

## 2020-07-21 DIAGNOSIS — Z1159 Encounter for screening for other viral diseases: Secondary | ICD-10-CM | POA: Diagnosis not present

## 2020-07-21 DIAGNOSIS — Z20828 Contact with and (suspected) exposure to other viral communicable diseases: Secondary | ICD-10-CM | POA: Diagnosis not present

## 2020-07-28 DIAGNOSIS — Z1159 Encounter for screening for other viral diseases: Secondary | ICD-10-CM | POA: Diagnosis not present

## 2020-07-28 DIAGNOSIS — Z20828 Contact with and (suspected) exposure to other viral communicable diseases: Secondary | ICD-10-CM | POA: Diagnosis not present

## 2020-08-24 ENCOUNTER — Other Ambulatory Visit: Payer: Self-pay

## 2020-08-24 ENCOUNTER — Emergency Department (HOSPITAL_COMMUNITY)
Admission: EM | Admit: 2020-08-24 | Discharge: 2020-08-24 | Disposition: A | Discharge status: Home / Self Care | Payer: Self-pay | Attending: Emergency Medicine | Admitting: Emergency Medicine

## 2020-08-24 ENCOUNTER — Emergency Department (HOSPITAL_COMMUNITY): Payer: Self-pay

## 2020-08-24 DIAGNOSIS — M79641 Pain in right hand: Secondary | ICD-10-CM | POA: Insufficient documentation

## 2020-08-24 DIAGNOSIS — R109 Unspecified abdominal pain: Secondary | ICD-10-CM | POA: Insufficient documentation

## 2020-08-24 DIAGNOSIS — M79642 Pain in left hand: Secondary | ICD-10-CM | POA: Insufficient documentation

## 2020-08-24 DIAGNOSIS — K5900 Constipation, unspecified: Secondary | ICD-10-CM | POA: Insufficient documentation

## 2020-08-24 DIAGNOSIS — R112 Nausea with vomiting, unspecified: Secondary | ICD-10-CM | POA: Insufficient documentation

## 2020-08-24 DIAGNOSIS — F17210 Nicotine dependence, cigarettes, uncomplicated: Secondary | ICD-10-CM | POA: Insufficient documentation

## 2020-08-24 DIAGNOSIS — M79605 Pain in left leg: Secondary | ICD-10-CM | POA: Insufficient documentation

## 2020-08-24 LAB — CBC WITH DIFFERENTIAL/PLATELET
Abs Immature Granulocytes: 0.02 10*3/uL (ref 0.00–0.07)
Basophils Absolute: 0.1 10*3/uL (ref 0.0–0.1)
Basophils Relative: 1 %
Eosinophils Absolute: 0.1 10*3/uL (ref 0.0–0.5)
Eosinophils Relative: 1 %
HCT: 40.7 % (ref 36.0–46.0)
Hemoglobin: 13.9 g/dL (ref 12.0–15.0)
Immature Granulocytes: 0 %
Lymphocytes Relative: 25 %
Lymphs Abs: 1.7 10*3/uL (ref 0.7–4.0)
MCH: 30.5 pg (ref 26.0–34.0)
MCHC: 34.2 g/dL (ref 30.0–36.0)
MCV: 89.5 fL (ref 80.0–100.0)
Monocytes Absolute: 0.7 10*3/uL (ref 0.1–1.0)
Monocytes Relative: 10 %
Neutro Abs: 4.4 10*3/uL (ref 1.7–7.7)
Neutrophils Relative %: 63 %
Platelets: 320 10*3/uL (ref 150–400)
RBC: 4.55 MIL/uL (ref 3.87–5.11)
RDW: 13.1 % (ref 11.5–15.5)
WBC: 7 10*3/uL (ref 4.0–10.5)
nRBC: 0 % (ref 0.0–0.2)

## 2020-08-24 LAB — URINALYSIS, ROUTINE W REFLEX MICROSCOPIC
Bilirubin Urine: NEGATIVE
Glucose, UA: NEGATIVE mg/dL
Hgb urine dipstick: NEGATIVE
Ketones, ur: NEGATIVE mg/dL
Leukocytes,Ua: NEGATIVE
Nitrite: NEGATIVE
Protein, ur: NEGATIVE mg/dL
Specific Gravity, Urine: 1.006 (ref 1.005–1.030)
pH: 8 (ref 5.0–8.0)

## 2020-08-24 LAB — COMPREHENSIVE METABOLIC PANEL
ALT: 19 U/L (ref 0–44)
AST: 18 U/L (ref 15–41)
Albumin: 4 g/dL (ref 3.5–5.0)
Alkaline Phosphatase: 59 U/L (ref 38–126)
Anion gap: 9 (ref 5–15)
BUN: 14 mg/dL (ref 6–20)
CO2: 25 mmol/L (ref 22–32)
Calcium: 8.6 mg/dL — ABNORMAL LOW (ref 8.9–10.3)
Chloride: 103 mmol/L (ref 98–111)
Creatinine, Ser: 0.98 mg/dL (ref 0.44–1.00)
GFR, Estimated: >60 mL/min (ref >60)
Glucose, Bld: 92 mg/dL (ref 70–99)
Potassium: 3.6 mmol/L (ref 3.5–5.1)
Sodium: 137 mmol/L (ref 135–145)
Total Bilirubin: 0.3 mg/dL (ref 0.3–1.2)
Total Protein: 7.3 g/dL (ref 6.5–8.1)

## 2020-08-24 LAB — LIPASE, BLOOD: Lipase: 35 U/L (ref 11–51)

## 2020-08-24 MED ORDER — ONDANSETRON 4 MG PO TBDP: 4.0000 mg | ORAL_TABLET | Freq: Three times a day (TID) | ORAL | 0 refills | Status: DC | PRN

## 2020-08-24 MED ORDER — IOHEXOL 300 MG/ML  SOLN
100.0000 mL | Freq: Once | INTRAMUSCULAR | Status: AC | PRN
  Administered 2020-08-24: 100 mL via INTRAVENOUS

## 2020-08-24 NOTE — ED Notes (Signed)
Discharge paperwork reviewed with pt. Pt requesting medication to have BM.  Pt educated on OTC medications available to help with BM.  Pt with no further questions or concerns at time of discharge. Ambulatory to ED exit to await ride.

## 2020-08-24 NOTE — Discharge Instructions (Signed)
Your CAT scan and blood work reassuring.  Follow-up with gastroenterology as planned.

## 2020-08-24 NOTE — ED Provider Notes (Signed)
[facility]-EMERGENCY DEPT Provider Note   CSN: [id number] Arrival date & time: 08/24/20  3536     History Chief Complaint  Patient presents with  . Constipation  . Abdominal Pain  . Emesis  . Leg Pain    left    Tiffany Conrad is a 40 y.o. female.  HPI Patient presents with several different complaints.  Constipation abdominal pain nausea vomiting.  Pain in the left leg and pain in both hands that will cramp up.  States she has been feeling bad for a month.  Started with nausea vomiting some diarrhea.  States she got seen in the ER and treated.  States that she has had more constipation over the last few days.  States she feels as if there is something in her rectal area.  Also feels something in her vaginal area.  No vaginal bleeding or discharge.  States that she has more pain on the left side of her abdomen.  When she attempted bowel movement she states that both of her arms will sort of cramp up and her left leg will feel numb at x2.  Has been primarily vomiting over the last month with less of the diarrhea.  States she was due to see GI yesterday but since she did not have insurance because she is switched jobs but canceled the appointment.  Patient states she is worried.  States she has not lost weight.  States she is worried that she is septic.    No past medical history on file.  There are no problems to display for this patient.   Past Surgical History:  Procedure Laterality Date  . COLPOSCOPY       OB History   No obstetric history on file.     No family history on file.  Social History   Tobacco Use  . Smoking status: Current Every Day Smoker    Packs/day: 0.50    Types: Cigarettes  Substance Use Topics  . Alcohol use: No  . Drug use: No    Home Medications Prior to Admission medications   Medication Sig Start Date End Date Taking? Authorizing Provider  acetaminophen (TYLENOL) 500 MG tablet Take 500 mg by mouth every 6 (six) hours  as needed for pain.   Yes [provider]  Carboxymethylcellul-Glycerin (CLEAR EYES FOR DRY EYES) 1-0.25 % SOLN Apply 1 drop to eye daily as needed (dry eyes).   Yes [provider]  ibuprofen (ADVIL,MOTRIN) 200 MG tablet Take 200-400 mg by mouth every 6 (six) hours as needed for pain.   Yes [provider]  loratadine (CLARITIN) 10 MG tablet Take 10 mg by mouth daily.   Yes [provider]  omeprazole (PRILOSEC) 20 MG capsule Take 1 capsule (20 mg total) by mouth daily. 07/20/20  Yes Fondaw, Wylder S, PA  ondansetron (ZOFRAN-ODT) 4 MG disintegrating tablet Take 1 tablet (4 mg total) by mouth every 8 (eight) hours as needed for nausea or vomiting. 08/24/20  Yes Benjiman Core, MD  PARoxetine (PAXIL) 10 MG tablet Take 10 mg by mouth daily. 08/07/20  Yes [provider]    Allergies    Patient has no known allergies.  Review of Systems   Review of Systems  Constitutional: Positive for appetite change.  HENT: Negative for congestion.   Respiratory: Negative for shortness of breath.   Cardiovascular: Negative for chest pain.  Gastrointestinal: Positive for abdominal pain, constipation, nausea and vomiting.  Genitourinary: Negative for flank pain, vaginal  bleeding and vaginal discharge.  Musculoskeletal: Positive for myalgias.  Skin: Negative for rash.  Neurological: Negative for weakness.  Psychiatric/Behavioral: Negative for confusion.    Physical Exam Updated Vital Signs BP (!) 147/89   Pulse 75   Temp 98.4 F (36.9 C)   Resp 15   Ht 5\' 2"  (1.575 m)   Wt 92.5 kg   LMP 08/13/2020 (Within Days) Comment: "week of christmas"  SpO2 100%   BMI 37.31 kg/m   Physical Exam Vitals and nursing note reviewed.  Constitutional:      Appearance: She is well-developed.  HENT:     Head: Normocephalic.  Eyes:     General: No scleral icterus. Cardiovascular:     Rate and Rhythm: Normal rate.  Pulmonary:     Effort: Pulmonary effort is normal.   Abdominal:     Comments: Lower abdominal tenderness without rebound or guarding.  Skin:    General: Skin is warm.     Capillary Refill: Capillary refill takes less than 2 seconds.  Neurological:     Mental Status: She is alert.     ED Results / Procedures / Treatments   Labs (all labs ordered are listed, but only abnormal results are displayed) Labs Reviewed  COMPREHENSIVE METABOLIC PANEL - Abnormal; Notable for the following components:      Result Value   Calcium 8.6 (*)    All other components within normal limits  URINALYSIS, ROUTINE W REFLEX MICROSCOPIC - Abnormal; Notable for the following components:   Color, Urine STRAW (*)    All other components within normal limits  LIPASE, BLOOD  CBC WITH DIFFERENTIAL/PLATELET    EKG None  Radiology CT ABDOMEN PELVIS W CONTRAST  Result Date: 08/24/2020 CLINICAL DATA:  Left lower quadrant abdominal pain 4 days. EXAM: CT ABDOMEN AND PELVIS WITH CONTRAST TECHNIQUE: Multidetector CT imaging of the abdomen and pelvis was performed using the standard protocol following bolus administration of intravenous contrast. CONTRAST:  130mL OMNIPAQUE IOHEXOL 300 MG/ML  SOLN COMPARISON:  06/30/2007 FINDINGS: Lower chest: The lung bases are clear of acute process. No pleural effusion or pulmonary lesions. The heart is normal in size. No pericardial effusion. The distal esophagus and aorta are unremarkable. Hepatobiliary: No focal hepatic lesions or intrahepatic biliary dilatation. The gallbladder is normal. No common bile duct dilatation. Pancreas: No mass, inflammation or ductal dilatation. Spleen: Normal size. No focal lesions. Adrenals/Urinary Tract: The adrenal glands and kidneys are unremarkable. No renal lesions or hydronephrosis. The bladder is unremarkable. Stomach/Bowel: The stomach, duodenum, small bowel and colon are unremarkable. No acute inflammatory changes, mass lesions or obstructive findings. The terminal ileum is normal. The appendix is  normal. There is moderate age advanced diffuse colonic diverticulosis but no findings for acute diverticulitis. Vascular/Lymphatic: The aorta is normal in caliber. No dissection. The branch vessels are patent. The major venous structures are patent. No mesenteric or retroperitoneal mass or adenopathy. Small scattered lymph nodes are noted. Reproductive: The uterus and ovaries are normal. Other: No pelvic mass or adenopathy. No free pelvic fluid collections. No inguinal mass or adenopathy. No abdominal wall hernia or subcutaneous lesions. Musculoskeletal: No significant bony findings. IMPRESSION: 1. No acute abdominal/pelvic findings, mass lesions or adenopathy. 2. Moderate age advanced diffuse colonic diverticulosis but no findings for acute diverticulitis. Electronically Signed   By: Marijo Sanes M.D.   On: 08/24/2020 11:38    Procedures Procedures (including critical care time)  Medications Ordered in ED Medications  iohexol (OMNIPAQUE) 300 MG/ML solution 100 mL (100  mLs Intravenous Contrast Given 08/24/20 1114)    ED Course  I have reviewed the triage vital signs and the nursing notes.  Pertinent labs & imaging results that were available during my care of the patient were reviewed by me and considered in my medical decision making (see chart for details).    MDM Rules/Calculators/A&P                          Patient with abdominal pain.  Has had for last month.  Had nausea and vomiting at times.  No fevers.  Lab work reassuring.  CT scan done and reassuring.  Feels somewhat better after treatment.  Had GI follow-up that was post yesterday but canceled due to insurance reasons.  Will need to follow-up.  Will discharge home. Final Clinical Impression(s) / ED Diagnoses Final diagnoses:  Abdominal pain, unspecified abdominal location    Rx / DC Orders ED Discharge Orders         Ordered    ondansetron (ZOFRAN-ODT) 4 MG disintegrating tablet  Every 8 hours PRN        08/24/20 1203            Benjiman Core, MD 08/24/20 1547

## 2020-08-24 NOTE — ED Notes (Signed)
Pt stated she's unable to provide urine sample at this time.

## 2020-08-24 NOTE — ED Triage Notes (Signed)
Pt BIBA from home-  Pt reports last 4 days every time she has a BM her shoulders and left leg "goes stiff."  Also c/o LLQ abd pain x4 days. Also c/o emesis x1 month.   AOx4, ambulatory.

## 2021-06-03 ENCOUNTER — Encounter (HOSPITAL_COMMUNITY): Payer: Self-pay | Admitting: Emergency Medicine

## 2021-06-03 ENCOUNTER — Other Ambulatory Visit: Payer: Self-pay

## 2021-06-03 ENCOUNTER — Emergency Department (HOSPITAL_COMMUNITY)
Admission: EM | Admit: 2021-06-03 | Discharge: 2021-06-03 | Disposition: A | Discharge status: Home / Self Care | Payer: Self-pay | Attending: Emergency Medicine | Admitting: Emergency Medicine

## 2021-06-03 ENCOUNTER — Emergency Department (HOSPITAL_COMMUNITY): Payer: Self-pay

## 2021-06-03 DIAGNOSIS — M79605 Pain in left leg: Secondary | ICD-10-CM

## 2021-06-03 DIAGNOSIS — F17210 Nicotine dependence, cigarettes, uncomplicated: Secondary | ICD-10-CM | POA: Insufficient documentation

## 2021-06-03 DIAGNOSIS — M79652 Pain in left thigh: Secondary | ICD-10-CM | POA: Insufficient documentation

## 2021-06-03 DIAGNOSIS — M25522 Pain in left elbow: Secondary | ICD-10-CM | POA: Insufficient documentation

## 2021-06-03 DIAGNOSIS — M79602 Pain in left arm: Secondary | ICD-10-CM

## 2021-06-03 LAB — PREGNANCY, URINE: Preg Test, Ur: NEGATIVE

## 2021-06-03 MED ORDER — METHOCARBAMOL 500 MG PO TABS: 500.0000 mg | ORAL_TABLET | Freq: Two times a day (BID) | ORAL | 0 refills | Status: DC

## 2021-06-03 NOTE — Discharge Instructions (Addendum)
Your x-rays were without any abnormality.  Your urine pregnancy test was negative.  Please follow-up with the [place] [facility] for a follow-up appointment.  You may always return to the emergency room for any emergent new symptoms.

## 2021-06-03 NOTE — ED Provider Notes (Signed)
[facility] EMERGENCY DEPARTMENT Provider Note   CSN: [id number] Arrival date & time: 06/03/21  1027     History Chief Complaint  Patient presents with   Arm Pain   Leg Pain    Tiffany Conrad is a 40 y.o. female.  HPI Patient is a 40 year old female states no medical history does states that she does smoke cigarettes occasionally  She is presented to the ER today with complaints of left arm pain to her left elbow for approximately 3 months also having left thigh pain seems to been ongoing for the past week.  Seems be worse with movement.  Does not seem to be exertional no recent trauma or injuries.  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  She also states that she feels that she is stressed and anxious because of her new job.  Does not currently have health insurance has not seen any primary care provider for any of these symptoms.  She denies any weakness, slurred speech confusion headaches vision changes leg pain or leg swelling.  No other associate symptoms.  No aggravating factors apart from those discussed above.     History reviewed. No pertinent past medical history.  There are no problems to display for this patient.   Past Surgical History:  Procedure Laterality Date   COLPOSCOPY       OB History   No obstetric history on file.     No family history on file.  Social History   Tobacco Use   Smoking status: Every Day    Packs/day: 0.50    Types: Cigarettes   Smokeless tobacco: Never  Substance Use Topics   Alcohol use: No   Drug use: No    Home Medications Prior to Admission medications   Medication Sig Start Date End Date Taking? Authorizing Provider  acetaminophen (TYLENOL) 500 MG tablet Take 500 mg by mouth every 6 (six) hours as needed for pain.    [provider]  Carboxymethylcellul-Glycerin (CLEAR EYES FOR DRY EYES) 1-0.25 % SOLN Apply 1 drop to eye daily as needed  (dry eyes).    [provider]  ibuprofen (ADVIL,MOTRIN) 200 MG tablet Take 200-400 mg by mouth every 6 (six) hours as needed for pain.    [provider]  loratadine (CLARITIN) 10 MG tablet Take 10 mg by mouth daily.    [provider]  omeprazole (PRILOSEC) 20 MG capsule Take 1 capsule (20 mg total) by mouth daily. 07/20/20   Deshayla Empson, Rodrigo Ran, PA  ondansetron (ZOFRAN-ODT) 4 MG disintegrating tablet Take 1 tablet (4 mg total) by mouth every 8 (eight) hours as needed for nausea or vomiting. 08/24/20   Benjiman Core, MD  PARoxetine (PAXIL) 10 MG tablet Take 10 mg by mouth daily. 08/07/20   [provider]    Allergies    Patient has no known allergies.  Review of Systems   Review of Systems  Constitutional:  Negative for chills and fever.  HENT:  Negative for congestion.   Eyes:  Negative for pain.  Respiratory:  Negative for cough and shortness of breath.   Cardiovascular:  Negative for chest pain and leg swelling.  Gastrointestinal:  Negative for abdominal pain and vomiting.  Genitourinary:  Negative for dysuria.  Musculoskeletal:  Negative for myalgias.       Left arm pain, left leg pain, myalgias  Skin:  Negative for rash.  Neurological:  Negative for dizziness and  headaches.   Physical Exam Updated Vital Signs BP (!) 158/97 (BP Location: Right Arm)   Pulse 96   Temp 98.5 F (36.9 C) (Oral)   Resp 14   LMP 05/11/2021   SpO2 100%   Physical Exam Vitals and nursing note reviewed.  Constitutional:      General: She is not in acute distress.    Appearance: She is obese.  HENT:     Head: Normocephalic and atraumatic.     Nose: Nose normal.  Eyes:     General: No scleral icterus. Cardiovascular:     Rate and Rhythm: Normal rate and regular rhythm.     Pulses: Normal pulses.     Heart sounds: Normal heart sounds.  Pulmonary:     Effort: Pulmonary effort is normal. No respiratory distress.     Breath sounds: No wheezing.  Abdominal:      Palpations: Abdomen is soft.     Tenderness: There is no abdominal tenderness.  Musculoskeletal:     Cervical back: Normal range of motion.     Right lower leg: No edema.     Left lower leg: No edema.     Comments: No C, T, L-spine tenderness palpation.  Moves all 4 extremities with full range of motion at all joints.  Skin:    General: Skin is warm and dry.     Capillary Refill: Capillary refill takes less than 2 seconds.  Neurological:     Mental Status: She is alert. Mental status is at baseline.     Comments: Alert and oriented to self, place, time and event.   Speech is fluent, clear without dysarthria or dysphasia.   Strength 5/5 in upper/lower extremities   Sensation intact in upper/lower extremities   Normal gait.  Negative Romberg. No pronator drift.  Normal finger-to-nose and feet tapping.  CN I not tested  CN II grossly intact visual fields bilaterally. Did not visualize posterior eye.  CN III, IV, VI PERRLA and EOMs intact bilaterally  CN V Intact sensation to sharp and light touch to the face  CN VII facial movements symmetric  CN VIII not tested  CN IX, X no uvula deviation, symmetric rise of soft palate  CN XI 5/5 SCM and trapezius strength bilaterally  CN XII Midline tongue protrusion, symmetric L/R movements   Psychiatric:        Mood and Affect: Mood normal.        Behavior: Behavior normal.    ED Results / Procedures / Treatments   Labs (all labs ordered are listed, but only abnormal results are displayed) Labs Reviewed  PREGNANCY, URINE    EKG None  Radiology DG Lumbar Spine Complete  Result Date: 06/03/2021 CLINICAL DATA:  Lateral chest pain that radiates down the left arm and left leg. EXAM: LUMBAR SPINE - COMPLETE 4+ VIEW COMPARISON:  CT abdomen pelvis 08/24/2020 FINDINGS: There is no evidence of lumbar spine fracture. Alignment is normal. Mild multilevel intervertebral disc space loss and spurring. No focal bone lesion. SI joints are open  and symmetric. Nonobstructive bowel gas pattern. IMPRESSION: No acute osseous abnormality in the lumbar spine. Electronically Signed   By: Emmaline Kluver M.D.   On: 06/03/2021 11:30   DG Humerus Left  Result Date: 06/03/2021 CLINICAL DATA:  Chest pain radiating down the arm EXAM: LEFT HUMERUS - 2+ VIEW COMPARISON:  None. FINDINGS: There is no evidence of fracture or other focal bone lesions. Soft tissues are unremarkable. IMPRESSION: Negative. Electronically Signed  By: Tiburcio Pea M.D.   On: 06/03/2021 11:25    Procedures Procedures   Medications Ordered in ED Medications - No data to display  ED Course  I have reviewed the triage vital signs and the nursing notes.  Pertinent labs & imaging results that were available during my care of the patient were reviewed by me and considered in my medical decision making (see chart for details).    MDM Rules/Calculators/A&P                          Patient is well-appearing 40 year old female complaining of ongoing left arm and left leg aches and pains.  Denies any weakness or trauma.  EKG normal sinus rhythm with no axis deviations no ST-T wave abnormalities of note no evidence of ischemia.  No x-ray findings on lumbar and left humeral plain films.  These were ordered in triage.  Patient is atraumatic I do not have any reason to believe the patient has occult fracture that was not found on radiography.  Urine pregnancy test was negative.  Discussed patient the importance of close follow-up with primary care provider.  She does not currently have 1 recommended the [place] [facility].  Provided with information for this office.   The medical records were personally reviewed by myself. I personally reviewed all lab results and interpreted all imaging studies and either concurred with their official read or contacted radiology for clarification. Additional history obtained from old records.  This patient appears reasonably  screened and I doubt any other medical condition requiring further workup, evaluation, or treatment in the ED at this time prior to discharge.   Patient's vitals are WNL apart from vital sign abnormalities discussed above, patient is in NAD, and able to ambulate in the ED at their baseline and able to tolerate PO.  Pain has been managed or a plan has been made for home management and has no complaints prior to discharge. Patient is comfortable with above plan and for discharge at this time. All questions were answered prior to disposition. Results from the ER workup discussed with the patient face to face and all questions answered to the best of my ability. The patient is safe for discharge with strict return precautions. Patient appears safe for discharge with appropriate follow-up. Conveyed my impression with the patient and they voiced understanding and are agreeable to plan.   An After Visit Summary was printed and given to the patient.  Portions of this note were generated with Scientist, clinical (histocompatibility and immunogenetics). Dictation errors may occur despite best attempts at proofreading.    Final Clinical Impression(s) / ED Diagnoses Final diagnoses:  Left leg pain  Left arm pain    Rx / DC Orders ED Discharge Orders     None        Gailen Shelter, [place] 06/03/21 1416    Margarita Grizzle, MD 06/03/21 1702

## 2021-06-03 NOTE — ED Provider Notes (Signed)
Emergency Medicine Provider Triage Evaluation Note  Tiffany Conrad , a 40 y.o. female  was evaluated in triage.  Pt complains of left arm pain times multiple months.  Started having left leg pain this week.  Comes and goes, worse with movement.  No trauma or recent injury, the patient works in [employer] and does a lot of lifting.  Has tried muscle relaxers and anti-inflammatories without relief.  Review of Systems  Positive: Left arm pain, left leg pain Negative: Headache, vision changes, dysarthria, leg swelling  Physical Exam  BP (!) 158/97 (BP Location: Right Arm)   Pulse 96   Temp 98.5 F (36.9 C) (Oral)   Resp 14   LMP 05/11/2021   SpO2 100%  Gen:   Awake, no distress   Resp:  Normal effort  MSK:   Moves extremities without difficulty.  Full range of motion, some midline tenderness to the lumbar spine.  Lower extremities equal bilaterally Other:  Pulses 2+ bilaterally, sensation fully intact.  Medical Decision Making  Medically screening exam initiated at 10:46 AM.  Appropriate orders placed.  Tiffany Conrad was informed that the remainder of the evaluation will be completed by another provider, this initial triage assessment does not replace that evaluation, and the importance of remaining in the ED until their evaluation is complete.  Not a stroke.  Suspect musculoskeletal pain, discussed with patient and she stated she wishes to have radiographs done.  Risk benefits discussed, we will proceed with imaging.   Theron Arista, PA-C 06/03/21 1048    Benjiman Core, MD 06/03/21 1055

## 2021-06-03 NOTE — ED Triage Notes (Signed)
C/o L lateral chest pain that radiates down L arm and L leg.  States she was seen for the same symptoms with arm in December and was told it may be anxiety.  Unsure if related to lifting at job.

## 2021-06-12 IMAGING — DX DG CHEST 2V
2 series · 2 of 2 positions shown · non-contrast
Comparison: None.

CLINICAL DATA: Chest pain

EXAM:
CHEST - 2 VIEW

[chest pa]
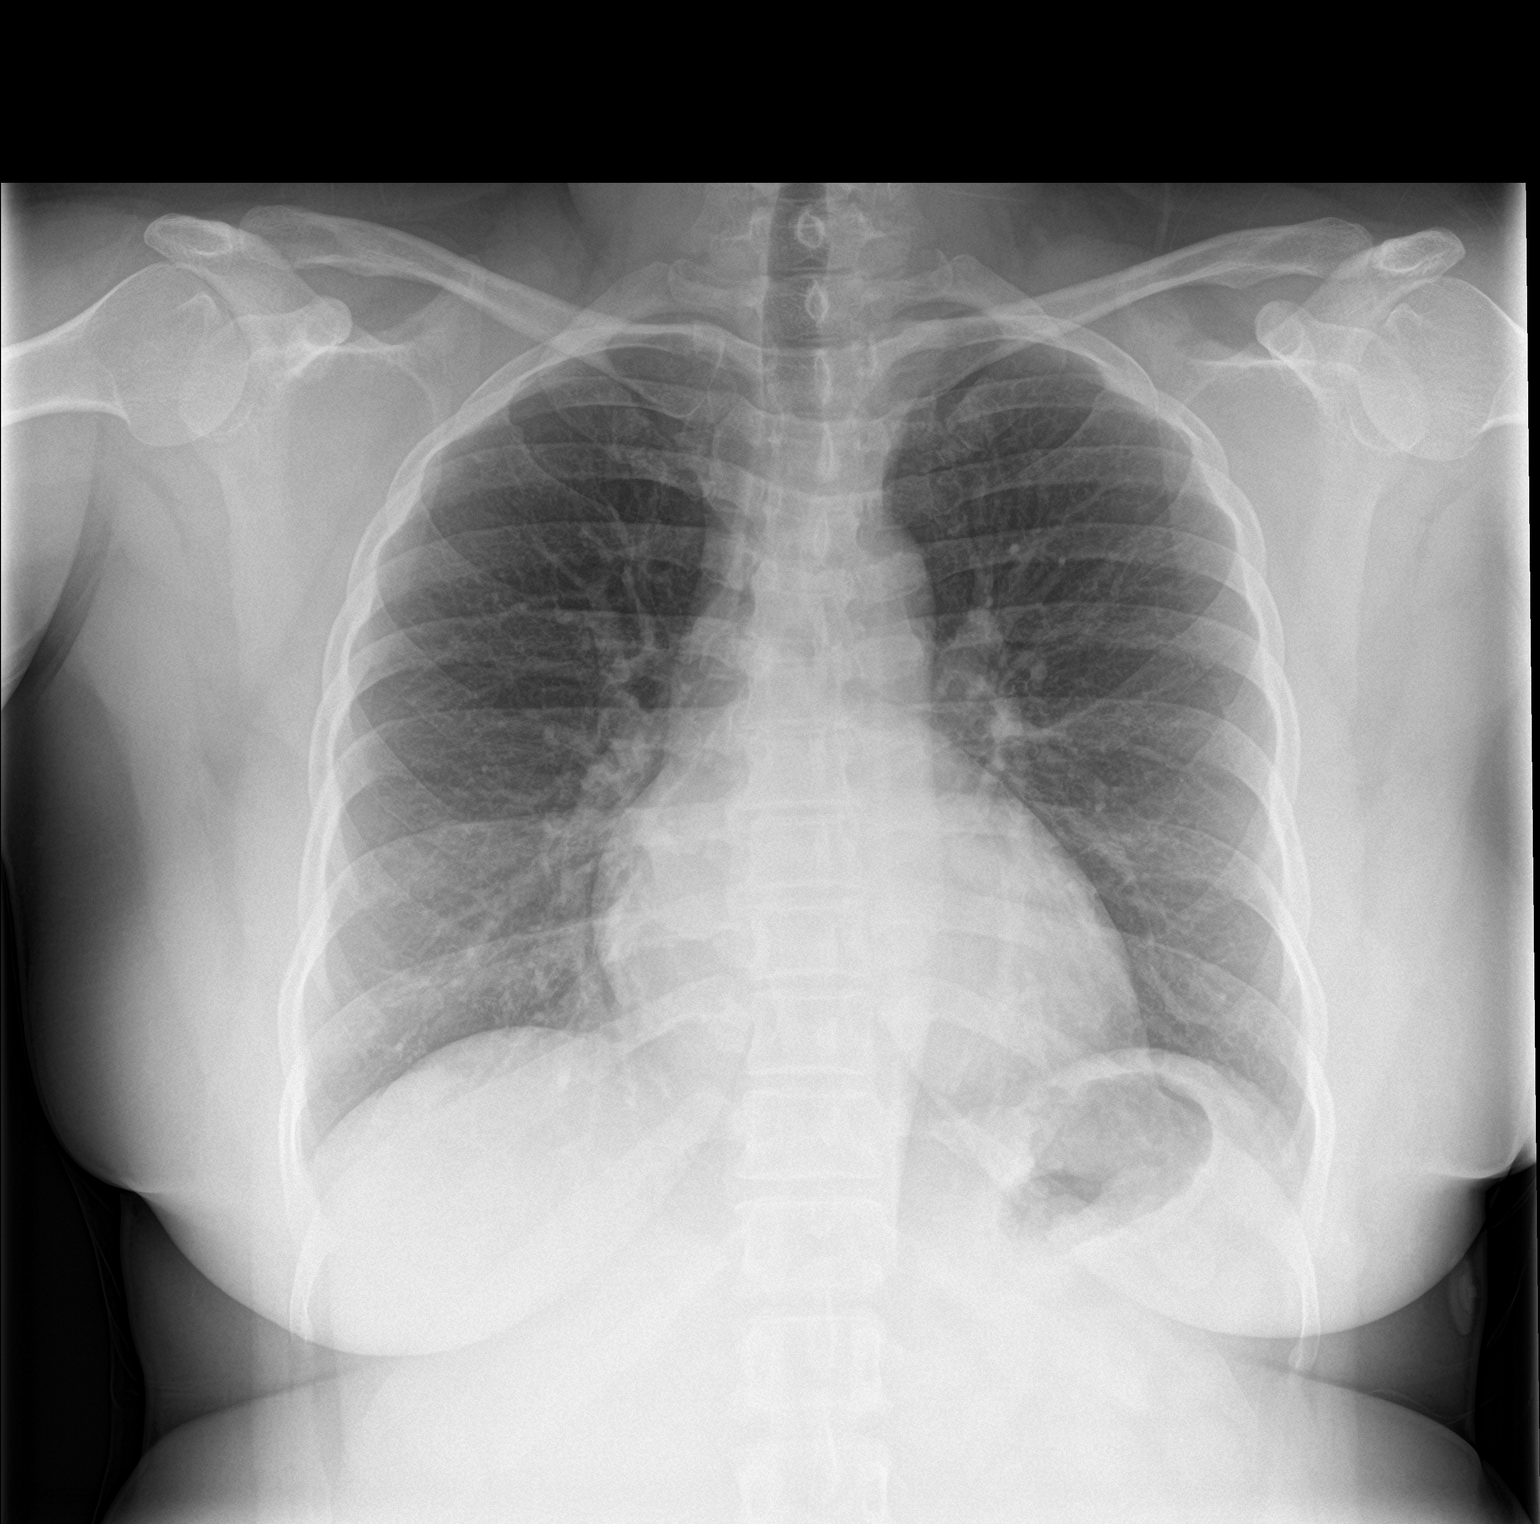

[chest lat]
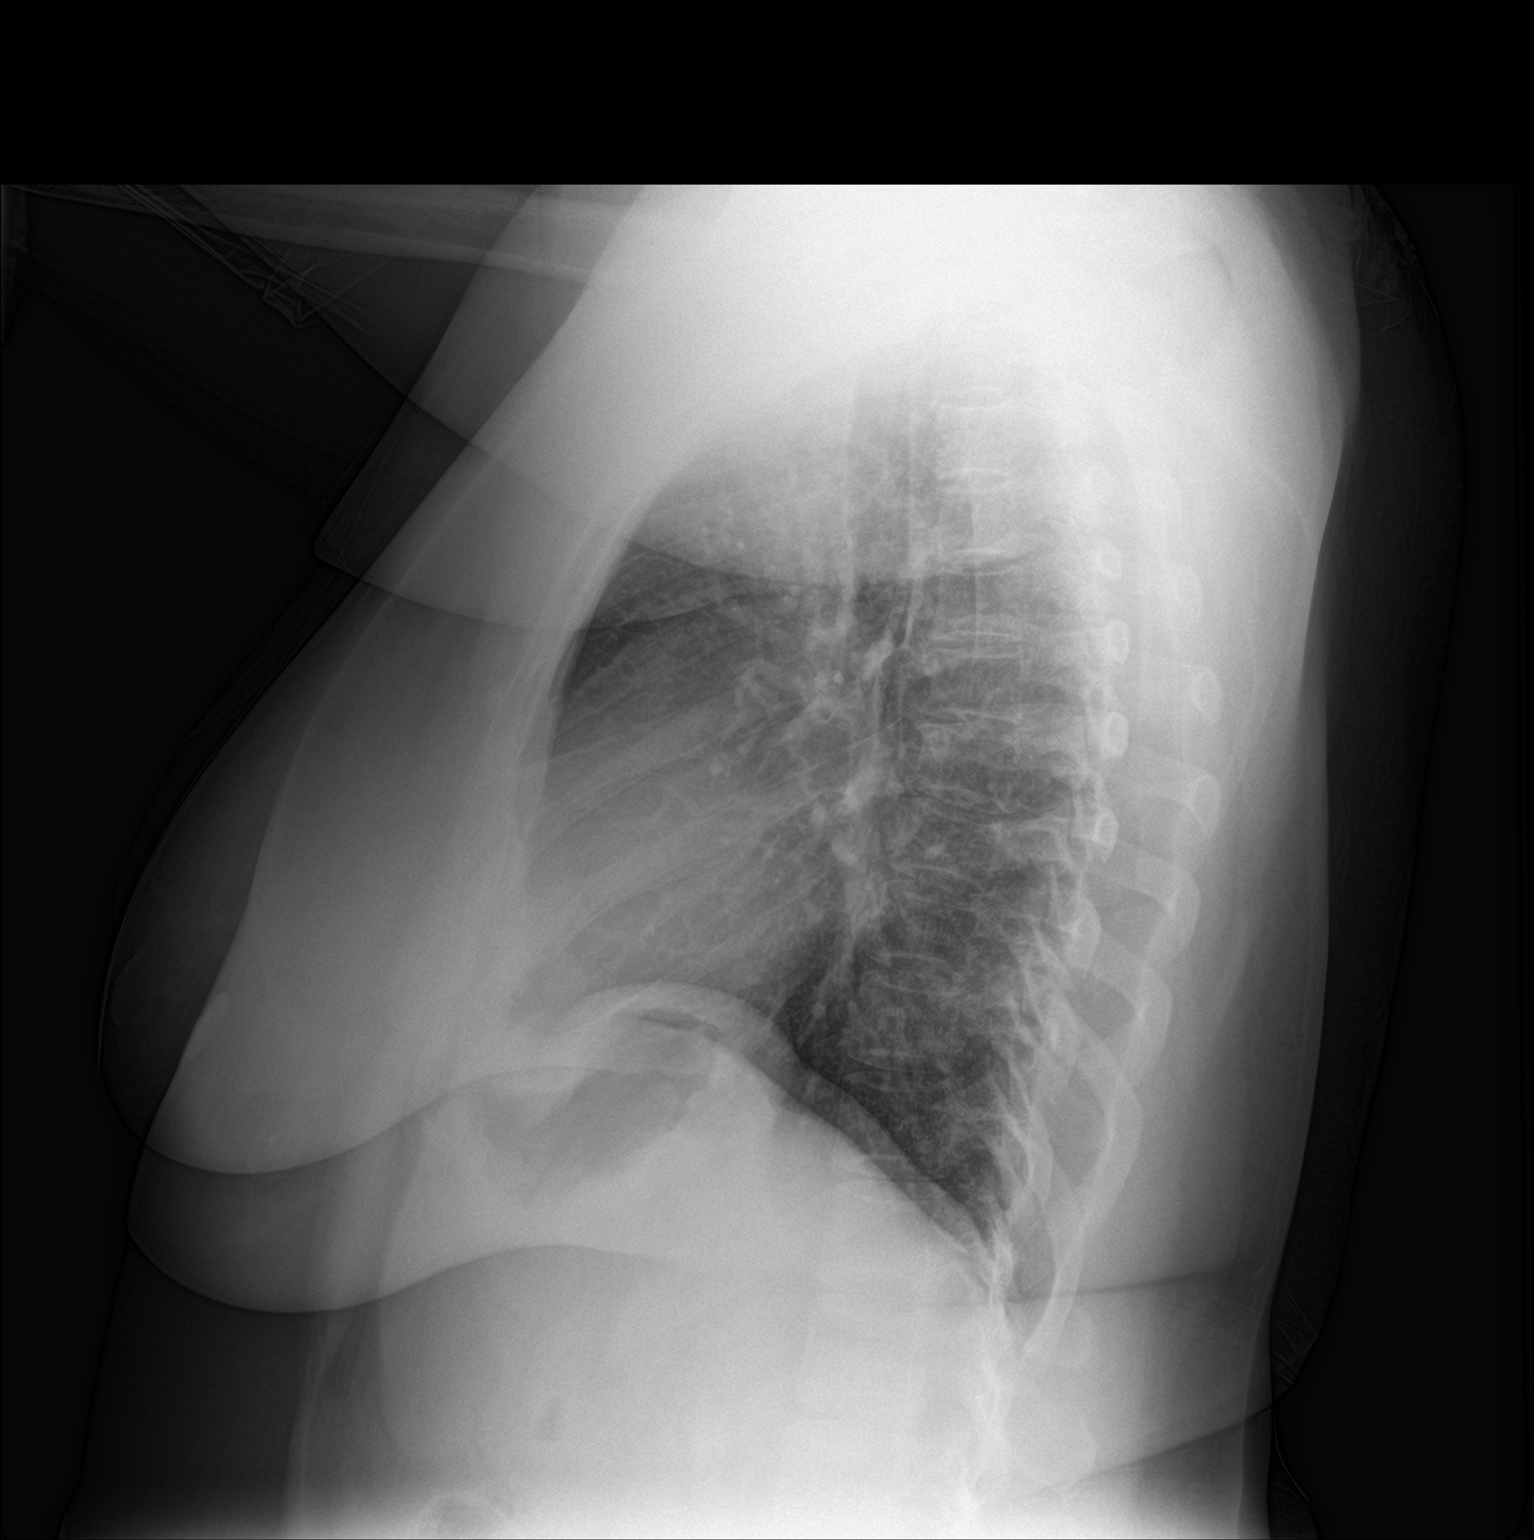

[2 of 2 positions shown; findings below may reference images not displayed]

FINDINGS: Normal heart size. Normal mediastinal contour. No pneumothorax. No
pleural effusion. Lungs appear clear, with no acute consolidative
airspace disease and no pulmonary edema.
IMPRESSION: No active cardiopulmonary disease.

## 2021-06-23 ENCOUNTER — Ambulatory Visit: Payer: Self-pay | Attending: Nurse Practitioner | Admitting: Nurse Practitioner

## 2021-06-23 ENCOUNTER — Other Ambulatory Visit: Payer: Self-pay

## 2021-06-23 ENCOUNTER — Encounter: Payer: Self-pay | Admitting: Nurse Practitioner

## 2021-06-23 VITALS — BP 136/85 | HR 92 | Ht 63.0 in | Wt 200.2 lb

## 2021-06-23 DIAGNOSIS — M79606 Pain in leg, unspecified: Secondary | ICD-10-CM

## 2021-06-23 DIAGNOSIS — F419 Anxiety disorder, unspecified: Secondary | ICD-10-CM

## 2021-06-23 DIAGNOSIS — Z7689 Persons encountering health services in other specified circumstances: Secondary | ICD-10-CM

## 2021-06-23 DIAGNOSIS — M79603 Pain in arm, unspecified: Secondary | ICD-10-CM

## 2021-06-23 MED ORDER — PAROXETINE HCL 10 MG PO TABS
10.0000 mg | ORAL_TABLET | Freq: Every day | ORAL | 1 refills | Status: AC
  Filled 2021-06-23: qty 30, 30d supply, fill #0

## 2021-06-23 MED ORDER — METHOCARBAMOL 500 MG PO TABS
500.0000 mg | ORAL_TABLET | Freq: Two times a day (BID) | ORAL | 1 refills | Status: AC
  Filled 2021-06-23: qty 60, 30d supply, fill #0

## 2021-06-23 MED ORDER — METHOCARBAMOL 500 MG PO TABS: 500.0000 mg | ORAL_TABLET | Freq: Two times a day (BID) | ORAL | 1 refills | Status: DC

## 2021-06-23 MED ORDER — IBUPROFEN 600 MG PO TABS
600.0000 mg | ORAL_TABLET | Freq: Four times a day (QID) | ORAL | 1 refills | Status: DC | PRN
  Filled 2021-06-23: qty 60, 15d supply, fill #0

## 2021-06-23 NOTE — Progress Notes (Signed)
Assessment & Plan:  Tiffany Conrad was seen today for establish care.  Diagnoses and all orders for this visit:  Encounter to establish care  Anxiety -     PARoxetine (PAXIL) 10 MG tablet; Take 1 tablet (10 mg total) by mouth daily.  Arm and leg pain -     ibuprofen (ADVIL) 600 MG tablet; Take 1 tablet (600 mg total) by mouth every 6 (six) hours as needed. -     Basic metabolic panel -     methocarbamol (ROBAXIN) 500 MG tablet; Take 1 tablet (500 mg total) by mouth 2 (two) times daily.   Patient has been counseled on age-appropriate routine health concerns for screening and prevention. These are reviewed and up-to-date. Referrals have been placed accordingly. Immunizations are up-to-date or declined.    Subjective:   Chief Complaint  Patient presents with   Establish Care   HPI Tiffany Conrad 40 y.o. female presents to office today to establish care. She has no past medical history on file.   Endorses left upper arm stiffness that radiates down to her left hand with associated numbness. States she was diagnosed with left carpal tunnel many years ago however she was never evaluated by a hand specialist. She denies pain in her neck or back. 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  Her pain is worse with anxiety. She has not picked up the methocarbamol that was ordered for her.     Review of Systems  Constitutional:  Negative for fever, malaise/fatigue and weight loss.  HENT: Negative.  Negative for nosebleeds.   Eyes: Negative.  Negative for blurred vision, double vision and photophobia.  Respiratory: Negative.  Negative for cough and shortness of breath.   Cardiovascular: Negative.  Negative for chest pain, palpitations and leg swelling.  Gastrointestinal: Negative.  Negative for heartburn, nausea and vomiting.  Musculoskeletal:  Positive for myalgias.       SEE HPI  Neurological: Negative.  Negative for dizziness, focal weakness, seizures and headaches.   Psychiatric/Behavioral:  Negative for suicidal ideas. The patient is nervous/anxious.    History reviewed. No pertinent past medical history.  Past Surgical History:  Procedure Laterality Date   COLPOSCOPY      History reviewed. No pertinent family history.  Social History Reviewed with no changes to be made today.   Outpatient Medications Prior to Visit  Medication Sig Dispense Refill   acetaminophen (TYLENOL) 500 MG tablet Take 500 mg by mouth every 6 (six) hours as needed for pain.     methocarbamol (ROBAXIN) 500 MG tablet Take 1 tablet (500 mg total) by mouth 2 (two) times daily. 20 tablet 0   Carboxymethylcellul-Glycerin (CLEAR EYES FOR DRY EYES) 1-0.25 % SOLN Apply 1 drop to eye daily as needed (dry eyes). (Patient not taking: Reported on 06/23/2021)     loratadine (CLARITIN) 10 MG tablet Take 10 mg by mouth daily. (Patient not taking: Reported on 06/23/2021)     omeprazole (PRILOSEC) 20 MG capsule Take 1 capsule (20 mg total) by mouth daily. (Patient not taking: Reported on 06/23/2021) 14 capsule 0   ondansetron (ZOFRAN-ODT) 4 MG disintegrating tablet Take 1 tablet (4 mg total) by mouth every 8 (eight) hours as needed for nausea or vomiting. (Patient not taking: Reported on 06/23/2021) 8 tablet 0   ibuprofen (ADVIL,MOTRIN) 200 MG tablet Take 200-400 mg by mouth every 6 (six) hours as needed for pain. (Patient not taking: Reported on 06/23/2021)     PARoxetine (PAXIL) 10 MG  tablet Take 10 mg by mouth daily. (Patient not taking: Reported on 06/23/2021)     No facility-administered medications prior to visit.    No Known Allergies     Objective:    BP 136/85   Pulse 92   Ht 5\' 3"  (1.6 m)   Wt 200 lb 4 oz (90.8 kg)   LMP 06/07/2021 (Approximate)   SpO2 97%   BMI 35.47 kg/m  Wt Readings from Last 3 Encounters:  06/23/21 200 lb 4 oz (90.8 kg)  08/24/20 204 lb (92.5 kg)    Physical Exam Vitals and nursing note reviewed.  Constitutional:      Appearance: She is  well-developed.  HENT:     Head: Normocephalic and atraumatic.  Cardiovascular:     Rate and Rhythm: Normal rate and regular rhythm.     Heart sounds: Normal heart sounds. No murmur heard.   No friction rub. No gallop.  Pulmonary:     Effort: Pulmonary effort is normal. No tachypnea or respiratory distress.     Breath sounds: Normal breath sounds. No decreased breath sounds, wheezing, rhonchi or rales.  Chest:     Chest wall: No tenderness.  Abdominal:     General: Bowel sounds are normal.     Palpations: Abdomen is soft.  Musculoskeletal:        General: Normal range of motion.     Left upper arm: Tenderness present. No lacerations.       Arms:     Cervical back: Normal range of motion.  Skin:    General: Skin is warm and dry.  Neurological:     Mental Status: She is alert and oriented to person, place, and time.     Coordination: Coordination normal.  Psychiatric:        Behavior: Behavior normal. Behavior is cooperative.        Thought Content: Thought content normal.        Judgment: Judgment normal.         Patient has been counseled extensively about nutrition and exercise as well as the importance of adherence with medications and regular follow-up. The patient was given clear instructions to go to ER or return to medical center if symptoms don't improve, worsen or new problems develop. The patient verbalized understanding.   Follow-up: Return in about 3 weeks (around 07/14/2021) for left arm and leg pain. Tele on tuesday.   Friday, FNP-BC [facility] [place], [place]   06/24/2021, 10:06 AM

## 2021-06-24 ENCOUNTER — Encounter: Payer: Self-pay | Admitting: Nurse Practitioner

## 2021-06-24 LAB — BASIC METABOLIC PANEL
BUN/Creatinine Ratio: 10 (ref 9–23)
BUN: 9 mg/dL (ref 6–20)
CO2: 22 mmol/L (ref 20–29)
Calcium: 9.3 mg/dL (ref 8.7–10.2)
Chloride: 101 mmol/L (ref 96–106)
Creatinine, Ser: 0.89 mg/dL (ref 0.57–1.00)
Glucose: 89 mg/dL (ref 70–99)
Potassium: 4.2 mmol/L (ref 3.5–5.2)
Sodium: 137 mmol/L (ref 134–144)
eGFR: 85 mL/min/{1.73_m2} (ref >59)

## 2021-08-23 ENCOUNTER — Emergency Department (HOSPITAL_COMMUNITY)
Admission: EM | Admit: 2021-08-23 | Discharge: 2021-08-24 | Discharge status: Against Medical Advice | Payer: BC Managed Care – PPO | Attending: Emergency Medicine | Admitting: Emergency Medicine

## 2021-08-23 ENCOUNTER — Encounter (HOSPITAL_COMMUNITY): Payer: Self-pay

## 2021-08-23 ENCOUNTER — Other Ambulatory Visit: Payer: Self-pay

## 2021-08-23 DIAGNOSIS — M79601 Pain in right arm: Secondary | ICD-10-CM | POA: Insufficient documentation

## 2021-08-23 DIAGNOSIS — Z20822 Contact with and (suspected) exposure to covid-19: Secondary | ICD-10-CM | POA: Insufficient documentation

## 2021-08-23 DIAGNOSIS — M79602 Pain in left arm: Secondary | ICD-10-CM | POA: Diagnosis not present

## 2021-08-23 DIAGNOSIS — Z5321 Procedure and treatment not carried out due to patient leaving prior to being seen by health care provider: Secondary | ICD-10-CM | POA: Diagnosis not present

## 2021-08-23 NOTE — ED Triage Notes (Signed)
Pt reports recent exposure to Covid and reports generalized body aches, worse on her arms, onset 3 days ago.

## 2021-08-24 LAB — RESP PANEL BY RT-PCR (FLU A&B, COVID) ARPGX2
Influenza A by PCR: NEGATIVE
Influenza B by PCR: NEGATIVE
SARS Coronavirus 2 by RT PCR: NEGATIVE

## 2021-08-25 ENCOUNTER — Other Ambulatory Visit: Payer: Self-pay

## 2021-08-25 ENCOUNTER — Encounter (HOSPITAL_COMMUNITY): Payer: Self-pay | Admitting: Emergency Medicine

## 2021-08-25 ENCOUNTER — Ambulatory Visit (HOSPITAL_COMMUNITY)
Admission: EM | Admit: 2021-08-25 | Discharge: 2021-08-25 | Disposition: A | Discharge status: Home / Self Care | Payer: BC Managed Care – PPO | Attending: Family Medicine | Admitting: Family Medicine

## 2021-08-25 DIAGNOSIS — M79602 Pain in left arm: Secondary | ICD-10-CM

## 2021-08-25 DIAGNOSIS — M79652 Pain in left thigh: Secondary | ICD-10-CM | POA: Diagnosis not present

## 2021-08-25 NOTE — ED Provider Notes (Signed)
[facility]    CSN: [id number] Arrival date & time: 08/25/21  1324      History   Chief Complaint Chief Complaint  Patient presents with   Arm Pain   Leg Pain    HPI Tiffany Conrad is a 41 y.o. female.    Arm Pain  Leg Pain Here for pain in left lateral prox thigh, and feeling there is a knot there. These symptoms started in the last week. Had some aches, and had been exposed to someone with covid, so was seen and test negative. Now feels there is a knot in the upper part of her left lateral thigh. No f/c.  Also has had tingling and pain in her left pinky/ulnar side of her forearm, and also in her upper arm, for about a year.  History reviewed. No pertinent past medical history.  There are no problems to display for this patient.   Past Surgical History:  Procedure Laterality Date   COLPOSCOPY      OB History   No obstetric history on file.      Home Medications    Prior to Admission medications   Medication Sig Start Date End Date Taking? Authorizing Provider  acetaminophen (TYLENOL) 500 MG tablet Take 500 mg by mouth every 6 (six) hours as needed for pain.    [provider]  ibuprofen (ADVIL) 600 MG tablet Take 1 tablet (600 mg total) by mouth every 6 (six) hours as needed. 06/23/21   Claiborne Rigg, NP  PARoxetine (PAXIL) 10 MG tablet Take 1 tablet (10 mg total) by mouth daily. 06/23/21 07/23/21  Claiborne Rigg, NP    Family History No family history on file.  Social History Social History   Tobacco Use   Smoking status: Every Day    Packs/day: 0.50    Types: Cigarettes   Smokeless tobacco: Never  Substance Use Topics   Alcohol use: No   Drug use: No     Allergies   Patient has no known allergies.   Review of Systems Review of Systems   Physical Exam Triage Vital Signs ED Triage Vitals  Enc Vitals Group     BP 08/25/21 1013 125/86     Pulse Rate 08/25/21 1013 73     Resp 08/25/21 1013 16     Temp 08/25/21  1013 98.3 F (36.8 C)     Temp Source 08/25/21 1013 Oral     SpO2 08/25/21 1013 96 %     Weight --      Height --      Head Circumference --      Peak Flow --      Pain Score 08/25/21 1012 8     Pain Loc --      Pain Edu? --      Excl. in GC? --    No data found.  Updated Vital Signs BP 125/86 (BP Location: Right Arm)    Pulse 73    Temp 98.3 F (36.8 C) (Oral)    Resp 16    LMP 08/03/2021 (Exact Date)    SpO2 96%   Visual Acuity Right Eye Distance:   Left Eye Distance:   Bilateral Distance:    Right Eye Near:   Left Eye Near:    Bilateral Near:     Physical Exam Vitals reviewed.  Constitutional:      General: She is not in acute distress.    Appearance: She is not toxic-appearing.  HENT:  Mouth/Throat:     Mouth: Mucous membranes are moist.  Cardiovascular:     Rate and Rhythm: Normal rate and regular rhythm.     Heart sounds: No murmur heard. Pulmonary:     Effort: Pulmonary effort is normal.     Breath sounds: Normal breath sounds.  Musculoskeletal:        General: Tenderness (over lateral prox thigh on left. No erythema or induration. I am unable to appreciate a mass in the subQ tissue) present.  Skin:    Coloration: Skin is not jaundiced or pale.  Neurological:     Mental Status: She is alert and oriented to person, place, and time.     Comments: Grip 5/5 bilaterally  Psychiatric:        Behavior: Behavior normal.     UC Treatments / Results  Labs (all labs ordered are listed, but only abnormal results are displayed) Labs Reviewed - No data to display  EKG   Radiology No results found.  Procedures Procedures (including critical care time)  Medications Ordered in UC Medications - No data to display  Initial Impression / Assessment and Plan / UC Course  I have reviewed the triage vital signs and the nursing notes.  Pertinent labs & imaging results that were available during my care of the patient were reviewed by me and considered in my  medical decision making (see chart for details).     Discussed that the testing she needs for her upper extremity symptoms we cannot do here. Also, xray unlikely to show [place] anything about her soft tissues here.  Final Clinical Impressions(s) / UC Diagnoses   Final diagnoses:  Pain of left lateral upper thigh  Pain in left arm     Discharge Instructions      Tylenol or ibuprofen as needed for pain.  Call your primary provider's office for an appointment, and also ask about the nerve condution test they were going to order.     ED Prescriptions   None    I have reviewed the PDMP during this encounter.   Zenia Resides, MD 08/25/21 1053

## 2021-08-25 NOTE — ED Triage Notes (Signed)
Pt reports left arm pains for a year.  This week having knot on left upper lateral leg that will move. Reports pain in leg with tingling.

## 2021-08-25 NOTE — Discharge Instructions (Signed)
Tylenol or ibuprofen as needed for pain.  Call your primary provider's office for an appointment, and also ask about the nerve condution test they were going to order.

## 2021-08-29 ENCOUNTER — Telehealth: Payer: Self-pay | Admitting: *Deleted

## 2021-08-29 NOTE — Telephone Encounter (Signed)
Copied from CRM [id number]. Topic: Referral - Request for Referral >> Aug 25, 2021 11:22 AM Marylen Ponto wrote: Has patient seen PCP for this complaint? Yes.   *If NO, is insurance requiring patient see PCP for this issue before PCP can refer them? Referral for which specialty: Hand Specialist Preferred provider/office: no specific provider Reason for referral: nerve pain

## 2021-08-30 ENCOUNTER — Ambulatory Visit: Payer: BC Managed Care – PPO | Attending: Physician Assistant | Admitting: Physician Assistant

## 2021-08-30 ENCOUNTER — Other Ambulatory Visit: Payer: Self-pay

## 2021-08-30 ENCOUNTER — Other Ambulatory Visit: Payer: Self-pay | Admitting: Nurse Practitioner

## 2021-08-30 DIAGNOSIS — G5602 Carpal tunnel syndrome, left upper limb: Secondary | ICD-10-CM

## 2021-08-30 NOTE — Telephone Encounter (Signed)
REFERRAL placed

## 2021-09-01 ENCOUNTER — Encounter: Payer: Self-pay | Admitting: Orthopedic Surgery

## 2021-09-01 ENCOUNTER — Other Ambulatory Visit: Payer: Self-pay

## 2021-09-01 ENCOUNTER — Ambulatory Visit: Payer: Self-pay

## 2021-09-01 ENCOUNTER — Ambulatory Visit (INDEPENDENT_AMBULATORY_CARE_PROVIDER_SITE_OTHER): Payer: BC Managed Care – PPO | Admitting: Orthopedic Surgery

## 2021-09-01 DIAGNOSIS — M25532 Pain in left wrist: Secondary | ICD-10-CM

## 2021-09-01 DIAGNOSIS — M79642 Pain in left hand: Secondary | ICD-10-CM | POA: Diagnosis not present

## 2021-09-01 MED ORDER — MELOXICAM 7.5 MG PO TABS: 7.5000 mg | ORAL_TABLET | Freq: Every day | ORAL | 0 refills | Status: AC

## 2021-09-01 NOTE — Progress Notes (Signed)
Office Visit Note   Patient: Tiffany Conrad           Date of Birth: 08-Jan-1981           MRN: [id number] Visit Date: 09/01/2021              Requested by: Claiborne Rigg, NP [address] [place],  [place] [address] PCP: Claiborne Rigg, NP   Assessment & Plan: Visit Diagnoses:  1. Pain in left wrist   2. Pain in left hand     Plan: Discussed with patient that I do not have a great explanation for her symptoms.  She has essentially no provocative signs.  She has no pain with range of motion or radial/ulnocarpal loading of the wrist.  She has negative provocative signs for both carpal and cubital tunnel syndrome.  She has no pain with making a complete fist or fully extending her fingers.  She has no evidence of DRUJ instability or foveal pain.  Her only pertinent finding is pain with palpation at multiple random places at the hand wrist and forearm.  She thinks she may have gotten some relief with over-the-counter ibuprofen we will try a stronger anti-inflammatory for a few weeks and see if this provides any symptom relief.  Follow-Up Instructions: No follow-ups on file.   Orders:  Orders Placed This Encounter  Procedures   XR Wrist Complete Left   No orders of the defined types were placed in this encounter.     Procedures: No procedures performed   Clinical Data: No additional findings.   Subjective: Chief Complaint  Patient presents with   Left Hand - New Patient (Initial Visit)    This is a 41 year old right-hand-dominant female who presents with multiple vague complaints involving the right hand and wrist.  She notes that he she has tingling in the small finger for the last year.  She has no tingling in the ring finger or other adjacent fingers.  This does not wake her up from sleep at night.  She denies any radiculopathy type symptoms.  She denies any numbness but describes it as "tingling".  She also notes that this finger is sometimes stiff and will  occasionally move by itself.  She also describes vague and poorly localized pain involving the hand.  She was unable to find any for specific locations of her pain.  She was given a prescription for ibuprofen which she thinks is helped some.   Review of Systems   Objective: Vital Signs: LMP 08/03/2021 (Exact Date)   Physical Exam Constitutional:      Appearance: Normal appearance.  Cardiovascular:     Rate and Rhythm: Normal rate.     Pulses: Normal pulses.  Pulmonary:     Effort: Pulmonary effort is normal.  Skin:    General: Skin is warm and dry.     Capillary Refill: Capillary refill takes less than 2 seconds.  Neurological:     Mental Status: She is alert.    Right Hand Exam   Tenderness  Right hand tenderness location: TTP at small finger MP joint w/out locking or catching.  TTP at multiple random places on the hand, forearm, elbow, and shoulder.  Range of Motion  The patient has normal right wrist ROM.   Muscle Strength  The patient has normal right wrist strength.  Other  Erythema: absent Sensation: normal Pulse: present  Comments:  Negative Tinel at wrist and elbow.  Negative Phalen and CT compression signs.  5/5 ulnar motor strength and thenar motor strength. No foveal pain.  No DRUJ instability.  No pain w/ ulnocarpal loading.  No pain at SL interval.  Pain w/ palpation at small finger MP joint w/ full ROM and no locking, catching, or palpable nodule.  Able to make full and complete fist without pain.      Specialty Comments:  No specialty comments available.  Imaging: Multiple views of the left left wrist taken today reviewed interpreted by me.  They demonstrate a old fracture of the tip of the ulnar styloid.  She has some questionable positive ulnar variance on these views, however, its unclear if these are zero rotation type views.  She has no evidence of DRUJ instability.  She has no radiocarpal degenerative changes.  There is no SL or LT diastases.   From the limited views given, there are no degenerative changes at the small finger MP joint.   PMFS History: Patient Active Problem List   Diagnosis Date Noted   Pain in left hand 09/01/2021   History reviewed. No pertinent past medical history.  History reviewed. No pertinent family history.  Past Surgical History:  Procedure Laterality Date   COLPOSCOPY     Social History   Occupational History   Not on file  Tobacco Use   Smoking status: Every Day    Packs/day: 0.50    Types: Cigarettes   Smokeless tobacco: Never  Substance and Sexual Activity   Alcohol use: No   Drug use: No   Sexual activity: Not on file

## 2022-09-19 ENCOUNTER — Ambulatory Visit: Payer: Self-pay

## 2022-09-19 NOTE — Telephone Encounter (Signed)
Pinching paoin 30 minutes and will not staty - no headache, runny nose -  Reason for Disposition . [1] SEVERE pain (e.g., excruciating, unable to do any normal activities) AND [2] not improved 2 hours after pain medicine  Answer Assessment - Initial Assessment Questions 1. ONSET: "When did the muscle aches or body pains start?"      chronic 2. LOCATION: "What part of your body is hurting?" (e.g., entire body, arms, legs)    Right thigh, left lateral knee,right arm pain and right toe pain 3. SEVERITY: "How bad is the pain?" (Scale 1-10; or mild, moderate, severe)   - MILD (1-3): doesn't interfere with normal activities    - MODERATE (4-7): interferes with normal activities or awakens from sleep    - SEVERE (8-10):  excruciating pain, unable to do any normal activities      Severe tension or sharp pain - pain hurt to palpation thigh pain Started Sunday on the left side then to right side, woke pt up at 0400 this am .feels like tension or charley horse  4. CAUSE: "What do you think is causing the pains?"     unsure 5. FEVER: "Have you been having fever?"     no 6. OTHER SYMPTOMS: "Do you have any other symptoms?" (e.g., chest pain, weakness, rash, cold or flu symptoms, weight loss)     no 7. PREGNANCY: "Is there any chance you are pregnant?" "When was your last menstrual period?"     /a 8. TRAVEL: "Have you traveled out of the country in the last month?" (e.g., travel history, exposures)     N/a  Protocols used: Muscle Aches and Body Pain-A-AH

## 2022-10-11 ENCOUNTER — Ambulatory Visit: Payer: Commercial Managed Care - HMO | Attending: Physician Assistant | Admitting: Physician Assistant

## 2022-10-11 ENCOUNTER — Other Ambulatory Visit: Payer: Self-pay

## 2022-10-11 ENCOUNTER — Encounter: Payer: Self-pay | Admitting: Physician Assistant

## 2022-10-11 VITALS — BP 145/94 | HR 89 | Wt 191.0 lb

## 2022-10-11 DIAGNOSIS — R030 Elevated blood-pressure reading, without diagnosis of hypertension: Secondary | ICD-10-CM

## 2022-10-11 DIAGNOSIS — F419 Anxiety disorder, unspecified: Secondary | ICD-10-CM

## 2022-10-11 DIAGNOSIS — M79606 Pain in leg, unspecified: Secondary | ICD-10-CM

## 2022-10-11 DIAGNOSIS — M79603 Pain in arm, unspecified: Secondary | ICD-10-CM

## 2022-10-11 MED ORDER — IBUPROFEN 600 MG PO TABS
600.0000 mg | ORAL_TABLET | Freq: Four times a day (QID) | ORAL | 1 refills | Status: AC | PRN
  Filled 2022-10-11: qty 60, 15d supply, fill #0

## 2022-10-11 MED ORDER — METHOCARBAMOL 500 MG PO TABS
1000.0000 mg | ORAL_TABLET | Freq: Three times a day (TID) | ORAL | 1 refills | Status: AC | PRN
  Filled 2022-10-11: qty 90, 15d supply, fill #0

## 2022-10-11 MED ORDER — GABAPENTIN 300 MG PO CAPS
300.0000 mg | ORAL_CAPSULE | Freq: Three times a day (TID) | ORAL | 1 refills | Status: AC
  Filled 2022-10-11: qty 90, 30d supply, fill #0

## 2022-10-11 NOTE — Progress Notes (Signed)
Patient ID: Tiffany Conrad, female   DOB: [birth year], 42 y.o.   MRN: [id number]     Tiffany Conrad, is a 42 y.o. female  [id number]  [id number]  DOB - 05/18/1981  Chief Complaint  Patient presents with   Leg Pain       Subjective:   Tiffany Conrad is a 42 y.o. female here today for  L leg and arm pain.  She has had this on and off for 2 years.  It does not occur often.  She has missed 2 days of work in the last year.  No numbess or weakness.  No CP. No SOB.    No problems updated.  ALLERGIES: No Known Allergies  PAST MEDICAL HISTORY: No past medical history on file.  MEDICATIONS AT HOME: Prior to Admission medications   Medication Sig Start Date End Date Taking? Authorizing Provider  gabapentin (NEURONTIN) 300 MG capsule Take 1 capsule (300 mg total) by mouth 3 (three) times daily. 10/11/22  Yes Argentina Donovan, PA-C  methocarbamol (ROBAXIN) 500 MG tablet Take 2 tablets (1,000 mg total) by mouth every 8 (eight) hours as needed for muscle spasms. 10/11/22  Yes Argentina Donovan, PA-C  acetaminophen (TYLENOL) 500 MG tablet Take 500 mg by mouth every 6 (six) hours as needed for pain. Patient not taking: Reported on 10/11/2022    [provider]  ibuprofen (ADVIL) 600 MG tablet Take 1 tablet (600 mg total) by mouth every 6 (six) hours as needed. 10/11/22   Argentina Donovan, PA-C  PARoxetine (PAXIL) 10 MG tablet Take 1 tablet (10 mg total) by mouth daily. 06/23/21 07/23/21  Gildardo Pounds, NP    ROS: Neg HEENT Neg resp Neg cardiac Neg GI Neg GU Neg psych Neg neuro  Objective:   Vitals:   10/11/22 1517  BP: (!) 145/94  Pulse: 89  SpO2: 94%  Weight: 191 lb (86.6 kg)   Exam General appearance : Awake, alert, not in any distress. Speech Clear. Not toxic looking HEENT: Atraumatic and Normocephalic, pupils equally reactive to light and accomodation Neck: Supple, no JVD. No cervical lymphadenopathy.  Chest: Good air entry bilaterally, CTAB.  No  rales/rhonchi/wheezing CVS: S1 S2 regular, no murmurs.  Full S&ROM of BUE and BLE.  DTR=intact B.  Extremities: B/L Lower Ext shows no edema, both legs are warm to touch Neurology: Awake alert, and oriented X 3, CN II-XII intact, Non focal Skin: No Rash  Data Review No results found for: "HGBA1C"  Assessment & Plan   1. Arm and leg pain - gabapentin (NEURONTIN) 300 MG capsule; Take 1 capsule (300 mg total) by mouth 3 (three) times daily.  Dispense: 90 capsule; Refill: 1 - ibuprofen (ADVIL) 600 MG tablet; Take 1 tablet (600 mg total) by mouth every 6 (six) hours as needed.  Dispense: 60 tablet; Refill: 1 - methocarbamol (ROBAXIN) 500 MG tablet; Take 2 tablets (1,000 mg total) by mouth every 8 (eight) hours as needed for muscle spasms.  Dispense: 90 tablet; Refill: 1 - Comprehensive metabolic panel  2. Anxiety Improved but would like labs - TSH - Vitamin D, 25-hydroxy  3. Elevated blood pressure reading Check BP OOO and record and bring to next visit - Comprehensive metabolic panel - TSH    Return if symptoms worsen or fail to improve.  The patient was given clear instructions to go to ER or return to medical center if symptoms don't improve, worsen or new problems develop. The patient verbalized understanding. The patient was  told to call to get lab results if they haven't heard anything in the next week.      Freeman Caldron, PA-C [facility] [place], [place]   10/11/2022, 3:36 PM

## 2022-10-12 ENCOUNTER — Other Ambulatory Visit: Payer: Self-pay | Admitting: Physician Assistant

## 2022-10-12 DIAGNOSIS — E559 Vitamin D deficiency, unspecified: Secondary | ICD-10-CM

## 2022-10-12 LAB — COMPREHENSIVE METABOLIC PANEL
ALT: 21 IU/L (ref 0–32)
AST: 21 IU/L (ref 0–40)
Albumin/Globulin Ratio: 2 (ref 1.2–2.2)
Albumin: 5 g/dL — ABNORMAL HIGH (ref 3.9–4.9)
Alkaline Phosphatase: 87 IU/L (ref 44–121)
BUN/Creatinine Ratio: 10 (ref 9–23)
BUN: 8 mg/dL (ref 6–20)
Bilirubin Total: 0.3 mg/dL (ref 0.0–1.2)
CO2: 23 mmol/L (ref 20–29)
Calcium: 9.7 mg/dL (ref 8.7–10.2)
Chloride: 103 mmol/L (ref 96–106)
Creatinine, Ser: 0.83 mg/dL (ref 0.57–1.00)
Globulin, Total: 2.5 g/dL (ref 1.5–4.5)
Glucose: 95 mg/dL (ref 70–99)
Potassium: 4.3 mmol/L (ref 3.5–5.2)
Sodium: 138 mmol/L (ref 134–144)
Total Protein: 7.5 g/dL (ref 6.0–8.5)
eGFR: 92 mL/min/{1.73_m2} (ref >59)

## 2022-10-12 LAB — TSH: TSH: 0.601 u[IU]/mL (ref 0.450–4.500)

## 2022-10-12 LAB — VITAMIN D 25 HYDROXY (VIT D DEFICIENCY, FRACTURES): Vit D, 25-Hydroxy: 10.1 ng/mL — ABNORMAL LOW (ref 30.0–100.0)

## 2022-10-12 MED ORDER — VITAMIN D (ERGOCALCIFEROL) 1.25 MG (50000 UNIT) PO CAPS: 50000.0000 [IU] | ORAL_CAPSULE | ORAL | 0 refills | Status: AC

## 2022-10-17 ENCOUNTER — Other Ambulatory Visit: Payer: Self-pay

## 2023-11-26 ENCOUNTER — Ambulatory Visit: Payer: Self-pay | Attending: Nurse Practitioner | Admitting: Nurse Practitioner

## 2024-08-17 ENCOUNTER — Emergency Department (HOSPITAL_BASED_OUTPATIENT_CLINIC_OR_DEPARTMENT_OTHER): Admitting: Radiology

## 2024-08-17 ENCOUNTER — Other Ambulatory Visit: Payer: Self-pay

## 2024-08-17 ENCOUNTER — Emergency Department (HOSPITAL_BASED_OUTPATIENT_CLINIC_OR_DEPARTMENT_OTHER)

## 2024-08-17 ENCOUNTER — Emergency Department (HOSPITAL_BASED_OUTPATIENT_CLINIC_OR_DEPARTMENT_OTHER)
Admission: EM | Admit: 2024-08-17 | Discharge: 2024-08-17 | Discharge status: Against Medical Advice | Attending: Emergency Medicine | Admitting: Emergency Medicine

## 2024-08-17 DIAGNOSIS — Y92410 Unspecified street and highway as the place of occurrence of the external cause: Secondary | ICD-10-CM | POA: Diagnosis not present

## 2024-08-17 DIAGNOSIS — R0789 Other chest pain: Secondary | ICD-10-CM | POA: Insufficient documentation

## 2024-08-17 DIAGNOSIS — Z5321 Procedure and treatment not carried out due to patient leaving prior to being seen by health care provider: Secondary | ICD-10-CM | POA: Diagnosis not present

## 2024-08-17 DIAGNOSIS — M25511 Pain in right shoulder: Secondary | ICD-10-CM | POA: Insufficient documentation

## 2024-08-17 DIAGNOSIS — R519 Headache, unspecified: Secondary | ICD-10-CM | POA: Diagnosis not present

## 2024-08-17 DIAGNOSIS — R1110 Vomiting, unspecified: Secondary | ICD-10-CM | POA: Diagnosis not present

## 2024-08-17 MED ORDER — ONDANSETRON 4 MG PO TBDP
4.0000 mg | ORAL_TABLET | Freq: Once | ORAL | Status: AC
  Administered 2024-08-17: 4 mg via ORAL
  Filled 2024-08-17: qty 1
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
